# Patient Record
Sex: Male | Born: 1935
Health system: Southern US, Community
[De-identification: ages and names within clinical notes are randomized; demographics above are authoritative.]

## PROBLEM LIST (undated history)

## (undated) DIAGNOSIS — K219 Gastro-esophageal reflux disease without esophagitis: Secondary | ICD-10-CM

## (undated) DIAGNOSIS — E785 Hyperlipidemia, unspecified: Secondary | ICD-10-CM

## (undated) DIAGNOSIS — I10 Essential (primary) hypertension: Secondary | ICD-10-CM

## (undated) DIAGNOSIS — Z8619 Personal history of other infectious and parasitic diseases: Secondary | ICD-10-CM

## (undated) DIAGNOSIS — E079 Disorder of thyroid, unspecified: Secondary | ICD-10-CM

## (undated) DIAGNOSIS — E039 Hypothyroidism, unspecified: Secondary | ICD-10-CM

## (undated) HISTORY — PX: OTHER SURGICAL HISTORY: SHX169

## (undated) HISTORY — PX: CHOLECYSTECTOMY: SHX55

## (undated) HISTORY — DX: Disorder of thyroid, unspecified: E07.9

## (undated) HISTORY — DX: Hyperlipidemia, unspecified: E78.5

## (undated) HISTORY — DX: Essential (primary) hypertension: I10

## (undated) HISTORY — DX: Personal history of other infectious and parasitic diseases: Z86.19

---
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
## 2006-11-07 ENCOUNTER — Ambulatory Visit (HOSPITAL_COMMUNITY): Admission: RE | Admit: 2006-11-07 | Discharge: 2006-11-07 | Payer: Self-pay | Admitting: Ophthalmology

## 2010-10-06 ENCOUNTER — Encounter: Payer: Self-pay | Admitting: Nurse Practitioner

## 2010-10-06 DIAGNOSIS — E039 Hypothyroidism, unspecified: Secondary | ICD-10-CM | POA: Insufficient documentation

## 2010-10-06 DIAGNOSIS — E785 Hyperlipidemia, unspecified: Secondary | ICD-10-CM

## 2010-10-06 DIAGNOSIS — I10 Essential (primary) hypertension: Secondary | ICD-10-CM | POA: Insufficient documentation

## 2010-10-06 DIAGNOSIS — E559 Vitamin D deficiency, unspecified: Secondary | ICD-10-CM | POA: Insufficient documentation

## 2012-08-20 ENCOUNTER — Other Ambulatory Visit: Payer: Self-pay | Admitting: Family Medicine

## 2012-08-31 ENCOUNTER — Emergency Department (HOSPITAL_COMMUNITY)
Admission: EM | Admit: 2012-08-31 | Discharge: 2012-08-31 | Disposition: A | Discharge status: Home / Self Care | Payer: Medicare Other | Attending: Emergency Medicine | Admitting: Emergency Medicine

## 2012-08-31 ENCOUNTER — Encounter: Payer: Self-pay | Admitting: Physician Assistant

## 2012-08-31 ENCOUNTER — Ambulatory Visit (INDEPENDENT_AMBULATORY_CARE_PROVIDER_SITE_OTHER): Payer: Medicare Other | Admitting: Physician Assistant

## 2012-08-31 ENCOUNTER — Encounter (HOSPITAL_COMMUNITY): Payer: Self-pay | Admitting: *Deleted

## 2012-08-31 VITALS — BP 135/59 | HR 65 | Temp 97.7°F | Ht 69.0 in | Wt 189.0 lb

## 2012-08-31 DIAGNOSIS — Z87891 Personal history of nicotine dependence: Secondary | ICD-10-CM | POA: Insufficient documentation

## 2012-08-31 DIAGNOSIS — Z79899 Other long term (current) drug therapy: Secondary | ICD-10-CM | POA: Insufficient documentation

## 2012-08-31 DIAGNOSIS — R21 Rash and other nonspecific skin eruption: Secondary | ICD-10-CM | POA: Insufficient documentation

## 2012-08-31 DIAGNOSIS — I10 Essential (primary) hypertension: Secondary | ICD-10-CM | POA: Insufficient documentation

## 2012-08-31 DIAGNOSIS — B029 Zoster without complications: Secondary | ICD-10-CM | POA: Insufficient documentation

## 2012-08-31 DIAGNOSIS — E079 Disorder of thyroid, unspecified: Secondary | ICD-10-CM | POA: Insufficient documentation

## 2012-08-31 DIAGNOSIS — E785 Hyperlipidemia, unspecified: Secondary | ICD-10-CM | POA: Insufficient documentation

## 2012-08-31 MED ORDER — TETRACAINE HCL 0.5 % OP SOLN
2.0000 [drp] | Freq: Once | OPHTHALMIC | Status: AC
  Administered 2012-08-31: 2 [drp] via OPHTHALMIC
  Filled 2012-08-31: qty 2

## 2012-08-31 MED ORDER — PREDNISONE 20 MG PO TABS: ORAL_TABLET | ORAL | Status: DC

## 2012-08-31 MED ORDER — VALACYCLOVIR HCL 1 G PO TABS: 1000.0000 mg | ORAL_TABLET | Freq: Three times a day (TID) | ORAL | Status: AC

## 2012-08-31 MED ORDER — FLUORESCEIN SODIUM 1 MG OP STRP
1.0000 | ORAL_STRIP | Freq: Once | OPHTHALMIC | Status: AC
  Administered 2012-08-31: 1 via OPHTHALMIC
  Filled 2012-08-31: qty 1

## 2012-08-31 NOTE — ED Notes (Signed)
Pt diagnosed with shingles 3-4 days ago, was sent to er by pcp today for itching/shingles.

## 2012-08-31 NOTE — ED Provider Notes (Addendum)
History  This chart was scribed for Suzi Roots, MD by Erskine Emery, ED Scribe. This patient was seen in room APA17/APA17 and the patient's care was started at 14:49.   CSN: [id number]  Arrival date & time 08/31/12  1233   First MD Initiated Contact with Patient 08/31/12 1449      Chief Complaint  Patient presents with  . Herpes Zoster    (Consider location/radiation/quality/duration/timing/severity/associated sxs/prior treatment) The history is provided by the patient and a relative. No language interpreter was used.  Aaron Salazar is a 79 y.o. male who presents to the Emergency Department complaining of itching and scaling shingles, localized to his left eyebrow, for the past couple days. Pt was diagnosed with the shingles by his PCP today, who instructed him to come to the ED to make sure it is not spreading to his eye. Pt denies any other areas of similar symptoms, any h/o of similar symptoms, or any pain, just itching, and a painful, burning sensation to rash.  No eye pain, tearing, fb sensation, or change in vision. Pt has been putting lotion on the area which does not relief the symptoms.   Dr. Christell Constant is the pt's PCP.  Past Medical History  Diagnosis Date  . Thyroid disease   . Hypertension   . Hyperlipidemia     Past Surgical History  Procedure Laterality Date  . Cholecystectomy      Family History  Problem Relation Age of Onset  . Mental retardation Son     History  Substance Use Topics  . Smoking status: Former Smoker    Quit date: 08/31/1992  . Smokeless tobacco: Not on file  . Alcohol Use: No      Review of Systems  Constitutional: Negative for fever and chills.  HENT: Negative for neck pain.   Eyes: Negative for photophobia, pain, discharge, redness, itching and visual disturbance.  Respiratory: Negative for shortness of breath.   Gastrointestinal: Negative for nausea and vomiting.  Skin: Positive for rash.  Neurological: Negative for weakness  and headaches.  All other systems reviewed and are negative.    Allergies  Review of patient's allergies indicates no known allergies.  Home Medications   Current Outpatient Rx  Name  Route  Sig  Dispense  Refill  . levothyroxine (SYNTHROID, LEVOTHROID) 50 MCG tablet   Oral   Take 50 mcg by mouth daily.           Marland Kitchen losartan (COZAAR) 100 MG tablet      TAKE 1 TABLET BY MOUTH DAILY   30 tablet   0     Needs office visit   . rosuvastatin (CRESTOR) 10 MG tablet   Oral   Take 10 mg by mouth daily.             Triage Vitals: BP 157/75  Pulse 93  Temp(Src) 98.4 F (36.9 C) (Oral)  Resp 18  Ht 5\' 9"  (1.753 m)  Wt 189 lb (85.73 kg)  BMI 27.9 kg/m2  SpO2 96%  Physical Exam  Nursing note and vitals reviewed. Constitutional: He is oriented to person, place, and time. He appears well-developed and well-nourished. No distress.  HENT:  Head: Normocephalic and atraumatic.  Eyes: Conjunctivae and EOM are normal. Pupils are equal, round, and reactive to light. Right eye exhibits no discharge. Left eye exhibits no discharge.  Conj not injected. No eye pain or tearing. Fluorescein neg, no corneal lesions noted.   Neck: Neck supple. No tracheal deviation present.  Cardiovascular: Normal rate.   Pulmonary/Chest: Effort normal. No respiratory distress.  Abdominal: Soft. He exhibits no distension.  Musculoskeletal: Normal range of motion. He exhibits no edema.  Neurological: He is alert and oriented to person, place, and time.  Skin: Skin is warm and dry. Rash noted.  Localized erythematous vesicular rash to left eyebrow, and small area left forehead.   Psychiatric: He has a normal mood and affect.    ED Course  Procedures (including critical care time) DIAGNOSTIC STUDIES: Oxygen Saturation is 96% on room air, adequate by my interpretation.    COORDINATION OF CARE: 14:58--I evaluated the patient and we discussed a treatment plan including eye examination to which the pt  agreed.       MDM  I personally performed the services described in this documentation, which was scribed in my presence. The recorded information has been reviewed and is accurate.  No apparent eye/corneal involvement.  Pt denies needing/wanting pain medication.  Pt stable for d/c.   Suzi Roots, MD 08/31/12 1536  Suzi Roots, MD 08/31/12 1540

## 2012-08-31 NOTE — ED Notes (Signed)
Discharge instructions reviewed with pt, questions answered. Pt verbalized understanding.  

## 2012-08-31 NOTE — Progress Notes (Signed)
  Subjective:    Patient ID: Aaron Salazar, male    DOB: 1927-06-25, 79 y.o.   MRN: [id number]  HPI Itchy rash over left eye for the past several days   Review of Systems     Objective:   Physical Exam Cluster of five vesicles on erythematous base over left eye; lesions are itchy       Assessment & Plan:  Shingles with optic involvement  Pt. Sent to [facility] ER

## 2012-09-03 DIAGNOSIS — Z8619 Personal history of other infectious and parasitic diseases: Secondary | ICD-10-CM

## 2012-09-03 HISTORY — DX: Personal history of other infectious and parasitic diseases: Z86.19

## 2012-09-15 ENCOUNTER — Other Ambulatory Visit: Payer: Self-pay | Admitting: Family Medicine

## 2012-09-23 ENCOUNTER — Encounter: Payer: Self-pay | Admitting: *Deleted

## 2012-09-23 ENCOUNTER — Other Ambulatory Visit: Payer: Self-pay | Admitting: *Deleted

## 2012-09-23 MED ORDER — ROSUVASTATIN CALCIUM 10 MG PO TABS: 10.0000 mg | ORAL_TABLET | Freq: Every day | ORAL | Status: DC

## 2012-11-17 ENCOUNTER — Other Ambulatory Visit: Payer: Self-pay | Admitting: Family Medicine

## 2012-11-29 ENCOUNTER — Ambulatory Visit (INDEPENDENT_AMBULATORY_CARE_PROVIDER_SITE_OTHER): Payer: Medicare Other | Admitting: Family Medicine

## 2012-11-29 ENCOUNTER — Encounter: Payer: Self-pay | Admitting: Family Medicine

## 2012-11-29 VITALS — BP 159/75 | HR 62 | Temp 97.1°F | Wt 190.8 lb

## 2012-11-29 DIAGNOSIS — E785 Hyperlipidemia, unspecified: Secondary | ICD-10-CM

## 2012-11-29 DIAGNOSIS — E039 Hypothyroidism, unspecified: Secondary | ICD-10-CM

## 2012-11-29 DIAGNOSIS — I10 Essential (primary) hypertension: Secondary | ICD-10-CM

## 2012-11-29 LAB — LIPID PANEL
Cholesterol: 158 mg/dL (ref 0–200)
HDL: 37 mg/dL — ABNORMAL LOW (ref >39)
LDL Cholesterol: 79 mg/dL (ref 0–99)
Total CHOL/HDL Ratio: 4.3 Ratio
Triglycerides: 210 mg/dL — ABNORMAL HIGH (ref <150)
VLDL: 42 mg/dL — ABNORMAL HIGH (ref 0–40)

## 2012-11-29 LAB — POCT CBC
Granulocyte percent: 73 %G (ref 37–80)
HCT, POC: 44.4 % (ref 43.5–53.7)
Hemoglobin: 15.9 g/dL (ref 14.1–18.1)
Lymph, poc: 1.5 (ref 0.6–3.4)
MCH, POC: 32.2 pg — AB (ref 27–31.2)
MCHC: 35.8 g/dL — AB (ref 31.8–35.4)
MCV: 90.1 fL (ref 80–97)
MPV: 8.2 fL (ref 0–99.8)
POC Granulocyte: 4.9 (ref 2–6.9)
POC LYMPH PERCENT: 22.9 %L (ref 10–50)
Platelet Count, POC: 191 10*3/uL (ref 142–424)
RBC: 4.9 M/uL (ref 4.69–6.13)
RDW, POC: 13.6 %
WBC: 6.7 10*3/uL (ref 4.6–10.2)

## 2012-11-29 LAB — COMPLETE METABOLIC PANEL WITH GFR
ALT: 18 U/L (ref 0–53)
AST: 26 U/L (ref 0–37)
Albumin: 4.1 g/dL (ref 3.5–5.2)
Alkaline Phosphatase: 63 U/L (ref 39–117)
BUN: 17 mg/dL (ref 6–23)
CO2: 25 mEq/L (ref 19–32)
Calcium: 9.2 mg/dL (ref 8.4–10.5)
Chloride: 104 mEq/L (ref 96–112)
Creat: 1.23 mg/dL (ref 0.50–1.35)
GFR, Est African American: 63 mL/min
GFR, Est Non African American: 54 mL/min — ABNORMAL LOW
Glucose, Bld: 94 mg/dL (ref 70–99)
Potassium: 4.6 mEq/L (ref 3.5–5.3)
Sodium: 138 mEq/L (ref 135–145)
Total Bilirubin: 0.6 mg/dL (ref 0.3–1.2)
Total Protein: 6.7 g/dL (ref 6.0–8.3)

## 2012-11-29 LAB — TSH: TSH: 4.592 u[IU]/mL — ABNORMAL HIGH (ref 0.350–4.500)

## 2012-11-29 MED ORDER — ROSUVASTATIN CALCIUM 10 MG PO TABS: 10.0000 mg | ORAL_TABLET | Freq: Every day | ORAL | Status: DC

## 2012-11-29 MED ORDER — LEVOTHYROXINE SODIUM 50 MCG PO TABS: 50.0000 ug | ORAL_TABLET | Freq: Every day | ORAL | Status: DC

## 2012-11-29 MED ORDER — LOSARTAN POTASSIUM 100 MG PO TABS: 100.0000 mg | ORAL_TABLET | Freq: Every day | ORAL | Status: DC

## 2012-11-29 NOTE — Patient Instructions (Signed)
Hypertriglyceridemia  Diet for High blood levels of Triglycerides Most fats in food are triglycerides. Triglycerides in your blood are stored as fat in your body. High levels of triglycerides in your blood may put you at a greater risk for heart disease and stroke.  Normal triglyceride levels are less than 150 mg/dL. Borderline high levels are 150-199 mg/dl. High levels are 200 - 499 mg/dL, and very high triglyceride levels are greater than 500 mg/dL. The decision to treat high triglycerides is generally based on the level. For people with borderline or high triglyceride levels, treatment includes weight loss and exercise. Drugs are recommended for people with very high triglyceride levels. Many people who need treatment for high triglyceride levels have metabolic syndrome. This syndrome is a collection of disorders that often include: insulin resistance, high blood pressure, blood clotting problems, high cholesterol and triglycerides. TESTING PROCEDURE FOR TRIGLYCERIDES  You should not eat 4 hours before getting your triglycerides measured. The normal range of triglycerides is between 10 and 250 milligrams per deciliter (mg/dl). Some people may have extreme levels (1000 or above), but your triglyceride level may be too high if it is above 150 mg/dl, depending on what other risk factors you have for heart disease.  People with high blood triglycerides may also have high blood cholesterol levels. If you have high blood cholesterol as well as high blood triglycerides, your risk for heart disease is probably greater than if you only had high triglycerides. High blood cholesterol is one of the main risk factors for heart disease. CHANGING YOUR DIET  Your weight can affect your blood triglyceride level. If you are more than 20% above your ideal body weight, you may be able to lower your blood triglycerides by losing weight. Eating less and exercising regularly is the best way to combat this. Fat provides more  calories than any other food. The best way to lose weight is to eat less fat. Only 30% of your total calories should come from fat. Less than 7% of your diet should come from saturated fat. A diet low in fat and saturated fat is the same as a diet to decrease blood cholesterol. By eating a diet lower in fat, you may lose weight, lower your blood cholesterol, and lower your blood triglyceride level.  Eating a diet low in fat, especially saturated fat, may also help you lower your blood triglyceride level. Ask your dietitian to help you figure how much fat you can eat based on the number of calories your caregiver has prescribed for you.  Exercise, in addition to helping with weight loss may also help lower triglyceride levels.   Alcohol can increase blood triglycerides. You may need to stop drinking alcoholic beverages.  Too much carbohydrate in your diet may also increase your blood triglycerides. Some complex carbohydrates are necessary in your diet. These may include bread, rice, potatoes, other starchy vegetables and cereals.  Reduce "simple" carbohydrates. These may include pure sugars, candy, honey, and jelly without losing other nutrients. If you have the kind of high blood triglycerides that is affected by the amount of carbohydrates in your diet, you will need to eat less sugar and less high-sugar foods. Your caregiver can help you with this.  Adding 2-4 grams of fish oil (EPA+ DHA) may also help lower triglycerides. Speak with your caregiver before adding any supplements to your regimen. Following the Diet  Maintain your ideal weight. Your caregivers can help you with a diet. Generally, eating less food and getting more   exercise will help you lose weight. Joining a weight control group may also help. Ask your caregivers for a good weight control group in your area.  Eat low-fat foods instead of high-fat foods. This can help you lose weight too.  These foods are lower in fat. Eat MORE of these:    Dried beans, peas, and lentils.  Egg whites.  Low-fat cottage cheese.  Fish.  Lean cuts of meat, such as round, sirloin, rump, and flank (cut extra fat off meat you fix).  Whole grain breads, cereals and pasta.  Skim and nonfat dry milk.  Low-fat yogurt.  Poultry without the skin.  Cheese made with skim or part-skim milk, such as mozzarella, parmesan, farmers', ricotta, or pot cheese. These are higher fat foods. Eat LESS of these:   Whole milk and foods made from whole milk, such as American, blue, cheddar, monterey jack, and swiss cheese  High-fat meats, such as luncheon meats, sausages, knockwurst, bratwurst, hot dogs, ribs, corned beef, ground pork, and regular ground beef.  Fried foods. Limit saturated fats in your diet. Substituting unsaturated fat for saturated fat may decrease your blood triglyceride level. You will need to read package labels to know which products contain saturated fats.  These foods are high in saturated fat. Eat LESS of these:   Fried pork skins.  Whole milk.  Skin and fat from poultry.  Palm oil.  Butter.  Shortening.  Cream cheese.  Bacon.  Margarines and baked goods made from listed oils.  Vegetable shortenings.  Chitterlings.  Fat from meats.  Coconut oil.  Palm kernel oil.  Lard.  Cream.  Sour cream.  Fatback.  Coffee whiteners and non-dairy creamers made with these oils.  Cheese made from whole milk. Use unsaturated fats (both polyunsaturated and monounsaturated) moderately. Remember, even though unsaturated fats are better than saturated fats; you still want a diet low in total fat.  These foods are high in unsaturated fat:   Canola oil.  Sunflower oil.  Mayonnaise.  Almonds.  Peanuts.  Pine nuts.  Margarines made with these oils.  Safflower oil.  Olive oil.  Avocados.  Cashews.  Peanut butter.  Sunflower seeds.  Soybean oil.  Peanut  oil.  Olives.  Pecans.  Walnuts.  Pumpkin seeds. Avoid sugar and other high-sugar foods. This will decrease carbohydrates without decreasing other nutrients. Sugar in your food goes rapidly to your blood. When there is excess sugar in your blood, your liver may use it to make more triglycerides. Sugar also contains calories without other important nutrients.  Eat LESS of these:   Sugar, brown sugar, powdered sugar, jam, jelly, preserves, honey, syrup, molasses, pies, candy, cakes, cookies, frosting, pastries, colas, soft drinks, punches, fruit drinks, and regular gelatin.  Avoid alcohol. Alcohol, even more than sugar, may increase blood triglycerides. In addition, alcohol is high in calories and low in nutrients. Ask for sparkling water, or a diet soft drink instead of an alcoholic beverage. Suggestions for planning and preparing meals   Bake, broil, grill or roast meats instead of frying.  Remove fat from meats and skin from poultry before cooking.  Add spices, herbs, lemon juice or vinegar to vegetables instead of salt, rich sauces or gravies.  Use a non-stick skillet without fat or use no-stick sprays.  Cool and refrigerate stews and broth. Then remove the hardened fat floating on the surface before serving.  Refrigerate meat drippings and skim off fat to make low-fat gravies.  Serve more fish.  Use less butter,   margarine and other high-fat spreads on bread or vegetables.  Use skim or reconstituted non-fat dry milk for cooking.  Cook with low-fat cheeses.  Substitute low-fat yogurt or cottage cheese for all or part of the sour cream in recipes for sauces, dips or congealed salads.  Use half yogurt/half mayonnaise in salad recipes.  Substitute evaporated skim milk for cream. Evaporated skim milk or reconstituted non-fat dry milk can be whipped and substituted for whipped cream in certain recipes.  Choose fresh fruits for dessert instead of high-fat foods such as pies or  cakes. Fruits are naturally low in fat. When Dining Out   Order low-fat appetizers such as fruit or vegetable juice, pasta with vegetables or tomato sauce.  Select clear, rather than cream soups.  Ask that dressings and gravies be served on the side. Then use less of them.  Order foods that are baked, broiled, poached, steamed, stir-fried, or roasted.  Ask for margarine instead of butter, and use only a small amount.  Drink sparkling water, unsweetened tea or coffee, or diet soft drinks instead of alcohol or other sweet beverages. QUESTIONS AND ANSWERS ABOUT OTHER FATS IN THE BLOOD: SATURATED FAT, TRANS FAT, AND CHOLESTEROL What is trans fat? Trans fat is a type of fat that is formed when vegetable oil is hardened through a process called hydrogenation. This process helps makes foods more solid, gives them shape, and prolongs their shelf life. Trans fats are also called hydrogenated or partially hydrogenated oils.  What do saturated fat, trans fat, and cholesterol in foods have to do with heart disease? Saturated fat, trans fat, and cholesterol in the diet all raise the level of LDL "bad" cholesterol in the blood. The higher the LDL cholesterol, the greater the risk for coronary heart disease (CHD). Saturated fat and trans fat raise LDL similarly.  What foods contain saturated fat, trans fat, and cholesterol? High amounts of saturated fat are found in animal products, such as fatty cuts of meat, chicken skin, and full-fat dairy products like butter, whole milk, cream, and cheese, and in tropical vegetable oils such as palm, palm kernel, and coconut oil. Trans fat is found in some of the same foods as saturated fat, such as vegetable shortening, some margarines (especially hard or stick margarine), crackers, cookies, baked goods, fried foods, salad dressings, and other processed foods made with partially hydrogenated vegetable oils. Small amounts of trans fat also occur naturally in some animal  products, such as milk products, beef, and lamb. Foods high in cholesterol include liver, other organ meats, egg yolks, shrimp, and full-fat dairy products. How can I use the new food label to make heart-healthy food choices? Check the Nutrition Facts panel of the food label. Choose foods lower in saturated fat, trans fat, and cholesterol. For saturated fat and cholesterol, you can also use the Percent Daily Value (%DV): 5% DV or less is low, and 20% DV or more is high. (There is no %DV for trans fat.) Use the Nutrition Facts panel to choose foods low in saturated fat and cholesterol, and if the trans fat is not listed, read the ingredients and limit products that list shortening or hydrogenated or partially hydrogenated vegetable oil, which tend to be high in trans fat. POINTS TO REMEMBER:   Discuss your risk for heart disease with your caregivers, and take steps to reduce risk factors.  Change your diet. Choose foods that are low in saturated fat, trans fat, and cholesterol.  Add exercise to your daily routine if   it is not already being done. Participate in physical activity of moderate intensity, like brisk walking, for at least 30 minutes on most, and preferably all days of the week. No time? Break the 30 minutes into three, 10-minute segments during the day.  Stop smoking. If you do smoke, contact your caregiver to discuss ways in which they can help you quit.  Do not use street drugs.  Maintain a normal weight.  Maintain a healthy blood pressure.  Keep up with your blood work for checking the fats in your blood as directed by your caregiver. Document Released: 03/09/2004 Document Revised: 11/21/2011 Document Reviewed: 10/05/2008 [employer] Patient Information 2014 [employer].  

## 2012-11-29 NOTE — Progress Notes (Signed)
  Subjective:    Patient ID: Aaron Salazar, male    DOB: 1927/06/10, 79 y.o.   MRN: [id number]  HPI This 79 y.o. male presents for evaluation of hypothyroidism, and hyperlipidemia.  He had a bout of shingles in March of this year and does not report any recent episodes of shingles.  He is fasting and is due for labs.  He is taking crestor for hyperlipidemia and does not report any intolerance to crestor.   Review of Systems    No chest pain, SOB, HA, dizziness, vision change, N/V, diarrhea, constipation, dysuria, urinary urgency or frequency, myalgias, arthralgias or rash.  Objective:   Physical Exam  Vital signs noted  Well developed well nourished male.  HEENT - Head atraumatic Normocephalic                Eyes - PERRLA, Conjuctiva - clear Sclera- Clear EOMI                Ears - EAC's Wnl TM's Wnl Gross Hearing WNL                Nose - Nares patent                 Throat - oropharanx wnl Respiratory - Lungs CTA bilateral Cardiac - RRR S1 and S2 without murmur GI - Abdomen soft Nontender and bowel sounds active x 4 Extremities - No edema. Neuro - Grossly intact. Skin - SK lesion on right face and solar keratosis on nose and forehead.     Assessment & Plan:  Essential hypertension, benign - Plan: POCT CBC, losartan (COZAAR) 100 MG tablet, COMPLETE METABOLIC PANEL WITH GFR  Unspecified hypothyroidism - Plan: TSH, levothyroxine (SYNTHROID, LEVOTHROID) 50 MCG tablet  Other and unspecified hyperlipidemia - Plan: Lipid panel, rosuvastatin (CRESTOR) 10 MG tablet, COMPLETE METABOLIC PANEL WITH GFR  Follow up in 6 months.  Declines referral to Derm for skin lesions on face and declines shingles vaccination.

## 2013-01-30 ENCOUNTER — Other Ambulatory Visit: Payer: Self-pay | Admitting: Family Medicine

## 2013-02-28 ENCOUNTER — Other Ambulatory Visit: Payer: Self-pay | Admitting: Family Medicine

## 2013-06-02 ENCOUNTER — Ambulatory Visit: Payer: Medicare Other | Admitting: Family Medicine

## 2013-06-03 ENCOUNTER — Other Ambulatory Visit: Payer: Self-pay | Admitting: Family Medicine

## 2013-06-03 ENCOUNTER — Other Ambulatory Visit: Payer: Self-pay | Admitting: Nurse Practitioner

## 2013-06-06 NOTE — Telephone Encounter (Signed)
Last seen and last lipid 11/29/12  B [place]

## 2013-06-06 NOTE — Telephone Encounter (Signed)
Last seen 11/29/12  B [place]

## 2013-06-10 ENCOUNTER — Telehealth: Payer: Self-pay | Admitting: Family Medicine

## 2013-06-12 ENCOUNTER — Other Ambulatory Visit: Payer: Self-pay | Admitting: Family Medicine

## 2013-06-12 MED ORDER — PRAVASTATIN SODIUM 40 MG PO TABS: 40.0000 mg | ORAL_TABLET | Freq: Every day | ORAL | Status: DC

## 2013-06-17 NOTE — Telephone Encounter (Signed)
Patient was switched from Crestor 40 mg to Pravastatin 40 mg

## 2013-07-02 ENCOUNTER — Other Ambulatory Visit: Payer: Self-pay | Admitting: Family Medicine

## 2013-08-02 ENCOUNTER — Other Ambulatory Visit: Payer: Self-pay | Admitting: Family Medicine

## 2013-08-23 ENCOUNTER — Other Ambulatory Visit: Payer: Self-pay | Admitting: Family Medicine

## 2013-09-02 ENCOUNTER — Other Ambulatory Visit: Payer: Self-pay | Admitting: *Deleted

## 2013-09-02 NOTE — Telephone Encounter (Signed)
Last ov 6/14. Ntbs. Last thyroid level 6/14.

## 2013-09-03 MED ORDER — LOSARTAN POTASSIUM 100 MG PO TABS: ORAL_TABLET | ORAL | Status: DC

## 2013-09-03 MED ORDER — LEVOTHYROXINE SODIUM 50 MCG PO TABS: ORAL_TABLET | ORAL | Status: DC

## 2013-10-07 ENCOUNTER — Other Ambulatory Visit: Payer: Self-pay | Admitting: Family Medicine

## 2013-10-10 ENCOUNTER — Other Ambulatory Visit: Payer: Self-pay | Admitting: Family Medicine

## 2013-10-13 NOTE — Telephone Encounter (Signed)
Last seen 11/29/12  B [place]

## 2013-11-20 ENCOUNTER — Other Ambulatory Visit: Payer: Self-pay | Admitting: Family Medicine

## 2013-12-08 ENCOUNTER — Other Ambulatory Visit: Payer: Self-pay | Admitting: Family Medicine

## 2013-12-11 ENCOUNTER — Other Ambulatory Visit: Payer: Self-pay | Admitting: Family Medicine

## 2014-01-06 ENCOUNTER — Other Ambulatory Visit: Payer: Self-pay | Admitting: Family Medicine

## 2014-01-07 NOTE — Telephone Encounter (Signed)
Patient notified at last refill that NTBS. Please advise 

## 2014-02-05 ENCOUNTER — Other Ambulatory Visit: Payer: Self-pay | Admitting: Family Medicine

## 2014-02-10 ENCOUNTER — Other Ambulatory Visit: Payer: Self-pay | Admitting: Family Medicine

## 2014-02-10 ENCOUNTER — Telehealth: Payer: Self-pay | Admitting: Family Medicine

## 2014-02-10 DIAGNOSIS — E039 Hypothyroidism, unspecified: Secondary | ICD-10-CM

## 2014-02-10 MED ORDER — LEVOTHYROXINE SODIUM 50 MCG PO TABS: 50.0000 ug | ORAL_TABLET | Freq: Every day | ORAL | Status: DC

## 2014-02-10 NOTE — Telephone Encounter (Signed)
rx for levothyroxine sent to [facility]

## 2014-02-23 ENCOUNTER — Ambulatory Visit: Payer: Medicare Other | Admitting: Family Medicine

## 2014-02-28 ENCOUNTER — Other Ambulatory Visit: Payer: Self-pay | Admitting: Family Medicine

## 2014-06-12 ENCOUNTER — Other Ambulatory Visit: Payer: Self-pay | Admitting: Family Medicine

## 2014-06-16 ENCOUNTER — Telehealth: Payer: Self-pay | Admitting: Family Medicine

## 2014-06-18 ENCOUNTER — Ambulatory Visit (INDEPENDENT_AMBULATORY_CARE_PROVIDER_SITE_OTHER): Payer: Medicare Other | Admitting: Family Medicine

## 2014-06-18 ENCOUNTER — Encounter: Payer: Self-pay | Admitting: Family Medicine

## 2014-06-18 VITALS — BP 156/85 | HR 86 | Temp 97.2°F | Ht 69.0 in | Wt 186.0 lb

## 2014-06-18 DIAGNOSIS — E039 Hypothyroidism, unspecified: Secondary | ICD-10-CM | POA: Diagnosis not present

## 2014-06-18 DIAGNOSIS — I10 Essential (primary) hypertension: Secondary | ICD-10-CM | POA: Diagnosis not present

## 2014-06-18 DIAGNOSIS — E785 Hyperlipidemia, unspecified: Secondary | ICD-10-CM | POA: Diagnosis not present

## 2014-06-18 MED ORDER — PRAVASTATIN SODIUM 40 MG PO TABS: 40.0000 mg | ORAL_TABLET | Freq: Every day | ORAL | Status: DC

## 2014-06-18 NOTE — Patient Instructions (Signed)
Place hyperlipidemia patient instructions here.

## 2014-06-18 NOTE — Progress Notes (Signed)
   Subjective:    Patient ID: Aaron Salazar, male    DOB: April 30, 1928, 81 y.o.   MRN: [id number]  HPI 81 year old gentleman here for refill on his cholesterol medicine. Basically he's healthy there is some history of hypothyroidism as well as hypertension, but he is not on medication for blood pressure. He denies any side effects from statins such as myalgias. He lives alone having been married 4 times. His diet consists mostly of prepackaged dinners that he heats in the microwave.    Review of Systems  Constitutional: Negative.   Respiratory: Negative.   Cardiovascular: Negative.   Gastrointestinal: Negative.   Neurological: Negative.   Psychiatric/Behavioral: Negative.        Objective:   Physical Exam  Constitutional: He is oriented to person, place, and time. He appears well-developed and well-nourished.  Neck: Normal range of motion.  Cardiovascular: Normal rate and regular rhythm.   Pulmonary/Chest: Effort normal and breath sounds normal.  Abdominal: Soft. He exhibits no mass.  Neurological: He is alert and oriented to person, place, and time.          Assessment & Plan:  1. Hyperlipidemia  - Lipid panel - CMP14+EGFR  2. Hypothyroidism, unspecified hypothyroidism type  - TSH  3. Essential hypertension On no meds. Pressure is acceptable level for age.  Wardell Honour MD

## 2014-06-18 NOTE — Progress Notes (Signed)
   Subjective:    Patient ID: Aaron Salazar, male    DOB: 10-Feb-1928, 81 y.o.   MRN: [id number]  HPI Patient comes in today to follow up on chronic medical conditions and to have medications refilled.  Patient Active Problem List   Diagnosis Date Noted  . Hypothyroidism 10/06/2010  . HTN (hypertension) 10/06/2010  . Hyperlipidemia 10/06/2010  . Vitamin D deficiency 10/06/2010   Outpatient Encounter Prescriptions as of 06/18/2014  Medication Sig  . docusate sodium (COLACE) 100 MG capsule Take 100 mg by mouth daily.  Marland Kitchen levothyroxine (SYNTHROID, LEVOTHROID) 50 MCG tablet Take 1 tablet (50 mcg total) by mouth daily before breakfast.  . pravastatin (PRAVACHOL) 40 MG tablet Take 1 tablet (40 mg total) by mouth daily.  . [DISCONTINUED] levothyroxine (SYNTHROID, LEVOTHROID) 50 MCG tablet TAKE 1 TABLET EVERY DAY  . [DISCONTINUED] losartan (COZAAR) 100 MG tablet TAKE 1 TABLET (100 MG TOTAL) BY MOUTH DAILY.  . [DISCONTINUED] CRESTOR 10 MG tablet TAKE 1 TABLET (10 MG TOTAL) BY MOUTH DAILY.  . [DISCONTINUED] levothyroxine (SYNTHROID, LEVOTHROID) 50 MCG tablet TAKE 1 TABLET EVERY DAY  . [DISCONTINUED] Omega-3 Fatty Acids (FISH OIL) 1000 MG CAPS Take 1,000 mg by mouth 2 (two) times daily.  . [DISCONTINUED] predniSONE (DELTASONE) 20 MG tablet 3 po once a day for 2 days, then 2 po once a day for 3 days, then 1 po once a day for 3 days      Review of Systems     Objective:   Physical Exam  BP 156/85 mmHg  Pulse 86  Temp(Src) 97.2 F (36.2 C) (Oral)  Ht 5\' 9"  (1.753 m)  Wt 186 lb (84.369 kg)  BMI 27.45 kg/m2       Assessment & Plan:

## 2014-06-19 LAB — TSH: TSH: 5.19 u[IU]/mL — ABNORMAL HIGH (ref 0.450–4.500)

## 2014-06-19 LAB — LIPID PANEL
CHOLESTEROL TOTAL: 202 mg/dL — AB (ref 100–199)
Chol/HDL Ratio: 5.8 ratio units — ABNORMAL HIGH (ref 0.0–5.0)
HDL: 35 mg/dL — ABNORMAL LOW (ref >39)
LDL CALC: 127 mg/dL — AB (ref 0–99)
Triglycerides: 200 mg/dL — ABNORMAL HIGH (ref 0–149)
VLDL Cholesterol Cal: 40 mg/dL (ref 5–40)

## 2014-06-19 LAB — CMP14+EGFR
A/G RATIO: 1.8 (ref 1.1–2.5)
ALBUMIN: 4.4 g/dL (ref 3.5–4.7)
ALK PHOS: 87 IU/L (ref 39–117)
ALT: 16 IU/L (ref 0–44)
AST: 19 IU/L (ref 0–40)
BILIRUBIN TOTAL: 0.7 mg/dL (ref 0.0–1.2)
BUN/Creatinine Ratio: 13 (ref 10–22)
BUN: 16 mg/dL (ref 8–27)
CALCIUM: 9.5 mg/dL (ref 8.6–10.2)
CHLORIDE: 100 mmol/L (ref 97–108)
CO2: 24 mmol/L (ref 18–29)
CREATININE: 1.28 mg/dL — AB (ref 0.76–1.27)
GFR calc non Af Amer: 51 mL/min/{1.73_m2} — ABNORMAL LOW (ref >59)
GFR, EST AFRICAN AMERICAN: 59 mL/min/{1.73_m2} — AB (ref >59)
GLUCOSE: 102 mg/dL — AB (ref 65–99)
Globulin, Total: 2.5 g/dL (ref 1.5–4.5)
Potassium: 4.6 mmol/L (ref 3.5–5.2)
Sodium: 139 mmol/L (ref 134–144)
TOTAL PROTEIN: 6.9 g/dL (ref 6.0–8.5)

## 2014-06-19 NOTE — Telephone Encounter (Signed)
Calling to go over test results.

## 2014-06-19 NOTE — Telephone Encounter (Signed)
-----   Message from Aaron Honour, MD sent at 06/19/2014  8:03 AM EST ----- LDL, bad cholesterol, has increased. Be sure and take statin every day and repeat in 6 months;  TSH results suggest he needs slightly higher dose of thyroid replacement increased from 50-75 g and repeat in 2 months

## 2014-08-08 ENCOUNTER — Other Ambulatory Visit: Payer: Self-pay | Admitting: Family Medicine

## 2014-10-29 ENCOUNTER — Ambulatory Visit (INDEPENDENT_AMBULATORY_CARE_PROVIDER_SITE_OTHER): Payer: Medicare Other | Admitting: Nurse Practitioner

## 2014-10-29 ENCOUNTER — Encounter: Payer: Self-pay | Admitting: Nurse Practitioner

## 2014-10-29 VITALS — BP 150/84 | HR 77 | Temp 97.1°F | Ht 69.0 in | Wt 184.0 lb

## 2014-10-29 DIAGNOSIS — H6122 Impacted cerumen, left ear: Secondary | ICD-10-CM | POA: Diagnosis not present

## 2014-10-29 NOTE — Progress Notes (Signed)
   Subjective:    Patient ID: Aaron Salazar, male    DOB: 1928-05-27, 81 y.o.   MRN: [id number]  HPI Patient in today c/o left ear feeling stopped up- slight decrease in hearing- no drainage- no pain    Review of Systems  Constitutional: Negative.   HENT: Negative.   Respiratory: Negative.   Cardiovascular: Negative.   Genitourinary: Negative.   Neurological: Negative.   Psychiatric/Behavioral: Negative.   All other systems reviewed and are negative.      Objective:   Physical Exam  Constitutional: He is oriented to person, place, and time. He appears well-developed and well-nourished. No distress.  HENT:  Right Ear: Hearing, tympanic membrane, external ear and ear canal normal.  Left Ear: Hearing, tympanic membrane and external ear normal. A foreign body (cerumen impaction) is present.  Neck: Normal range of motion.  Cardiovascular: Normal rate, regular rhythm and normal heart sounds.   Pulmonary/Chest: Effort normal and breath sounds normal.  Neurological: He is alert and oriented to person, place, and time.  Skin: Skin is warm.  Psychiatric: He has a normal mood and affect. His behavior is normal. Judgment and thought content normal.   BP 150/84 mmHg  Pulse 77  Temp(Src) 97.1 F (36.2 C) (Oral)  Ht '5\' 9"'$  (1.753 m)  Wt 184 lb (83.462 kg)  BMI 27.16 kg/m2  S/P ear irrigation- TM clear      Assessment & Plan:   1. Cerumen impaction, left    Debrox OTC several times a week RTO prn  Mary-Margaret Hassell Done, FNP

## 2014-10-29 NOTE — Patient Instructions (Signed)
Cerumen Impaction °A cerumen impaction is when the wax in your ear forms a plug. This plug usually causes reduced hearing. Sometimes it also causes an earache or dizziness. Removing a cerumen impaction can be difficult and painful. The wax sticks to the ear canal. The canal is sensitive and bleeds easily. If you try to remove a heavy wax buildup with a cotton tipped swab, you may push it in further. °Irrigation with water, suction, and small ear curettes may be used to clear out the wax. If the impaction is fixed to the skin in the ear canal, ear drops may be needed for a few days to loosen the wax. People who build up a lot of wax frequently can use ear wax removal products available in your local drugstore. °SEEK MEDICAL CARE IF:  °You develop an earache, increased hearing loss, or marked dizziness. °Document Released: 06/29/2004 Document Revised: 08/14/2011 Document Reviewed: 08/19/2009 °ExitCare® Patient Information ©2015 [employer]. This information is not intended to replace advice given to you by your health care provider. Make sure you discuss any questions you have with your health care provider. ° °

## 2015-03-23 ENCOUNTER — Ambulatory Visit (INDEPENDENT_AMBULATORY_CARE_PROVIDER_SITE_OTHER): Payer: Medicare Other | Admitting: Family Medicine

## 2015-03-23 ENCOUNTER — Encounter: Payer: Self-pay | Admitting: Family Medicine

## 2015-03-23 VITALS — BP 147/81 | HR 73 | Temp 98.1°F | Ht 69.0 in | Wt 187.6 lb

## 2015-03-23 DIAGNOSIS — E039 Hypothyroidism, unspecified: Secondary | ICD-10-CM

## 2015-03-23 NOTE — Progress Notes (Signed)
   Subjective:    Patient ID: Aaron Salazar, male    DOB: 1927/06/29, 81 y.o.   MRN: [id number]  HPI 81 year old gentleman here to follow-up. His only medications include thyroid replacement. He is not taking blood pressure or lipids pills.    Review of Systems  Constitutional: Negative.   HENT: Negative.   Respiratory: Negative.   Cardiovascular: Negative.   Gastrointestinal: Negative.   Neurological: Negative.   Psychiatric/Behavioral: Negative.        Objective:   Physical Exam  Constitutional: He is oriented to person, place, and time. He appears well-developed and well-nourished.  HENT:  Head: Normocephalic.  Cardiovascular: Normal rate and regular rhythm.   Pulmonary/Chest: Effort normal and breath sounds normal.  Neurological: He is alert and oriented to person, place, and time.  Psychiatric: He has a normal mood and affect. Thought content normal.          Assessment & Plan:  1. Hypothyroidism, unspecified hypothyroidism type *Patient was supposed to have increases thyroid medicine from 50-75 at last visit but I do not think this happened. We'll go ahead and wait till January and check it again. He has no other complaints or problems. Blood pressure is acceptable for age  Wardell Honour MD

## 2015-04-06 ENCOUNTER — Telehealth: Payer: Self-pay | Admitting: Family Medicine

## 2015-05-27 ENCOUNTER — Other Ambulatory Visit: Payer: Self-pay | Admitting: *Deleted

## 2015-05-27 MED ORDER — LEVOTHYROXINE SODIUM 50 MCG PO TABS: ORAL_TABLET | ORAL | Status: DC

## 2015-06-24 ENCOUNTER — Other Ambulatory Visit: Payer: Self-pay

## 2015-06-24 NOTE — Telephone Encounter (Signed)
Refill for one month but needs TSH checked yearly and it is time to do that

## 2015-06-24 NOTE — Telephone Encounter (Signed)
Last seen 03/23/15 Dr Sabra Heck   Last thyroid 06/18/14

## 2015-06-24 NOTE — Telephone Encounter (Signed)
Last seen 03/23/15  Dr Sabra Heck  Last thyroid level  06/18/14

## 2015-06-24 NOTE — Telephone Encounter (Signed)
NA to inform pt that they need labs drawn for thyroid

## 2015-06-25 ENCOUNTER — Other Ambulatory Visit: Payer: Self-pay

## 2015-06-25 NOTE — Telephone Encounter (Signed)
x

## 2015-06-25 NOTE — Telephone Encounter (Signed)
Last seen 03/23/15  Dr Sabra Heck  Last thyroid 06/18/14

## 2015-06-29 MED ORDER — LEVOTHYROXINE SODIUM 50 MCG PO TABS: ORAL_TABLET | ORAL | Status: DC

## 2015-07-13 ENCOUNTER — Ambulatory Visit: Payer: Medicare Other | Admitting: Family Medicine

## 2015-07-14 ENCOUNTER — Encounter: Payer: Self-pay | Admitting: Family Medicine

## 2015-07-20 ENCOUNTER — Encounter: Payer: Self-pay | Admitting: Family Medicine

## 2015-07-20 ENCOUNTER — Ambulatory Visit (INDEPENDENT_AMBULATORY_CARE_PROVIDER_SITE_OTHER): Payer: Medicare Other | Admitting: Family Medicine

## 2015-07-20 VITALS — BP 152/78 | HR 77 | Temp 97.4°F | Ht 69.0 in | Wt 184.4 lb

## 2015-07-20 DIAGNOSIS — E039 Hypothyroidism, unspecified: Secondary | ICD-10-CM

## 2015-07-20 DIAGNOSIS — L858 Other specified epidermal thickening: Secondary | ICD-10-CM | POA: Diagnosis not present

## 2015-07-20 DIAGNOSIS — E785 Hyperlipidemia, unspecified: Secondary | ICD-10-CM | POA: Diagnosis not present

## 2015-07-20 DIAGNOSIS — L821 Other seborrheic keratosis: Secondary | ICD-10-CM | POA: Diagnosis not present

## 2015-07-20 DIAGNOSIS — I10 Essential (primary) hypertension: Secondary | ICD-10-CM

## 2015-07-20 DIAGNOSIS — D485 Neoplasm of uncertain behavior of skin: Secondary | ICD-10-CM

## 2015-07-20 NOTE — Progress Notes (Signed)
   Subjective:    Patient ID: Lacey Jensen, male    DOB: 28-Apr-1928, 82 y.o.   MRN: [id number]  HPI 82 year old gentleman who is here today to check his thyroid area he takes replacement hormone and it was last checked 1 year ago. He also takes a statin for lipids. He has never had any heart problems. He is most concerned today about a small place on his face. It looks like a cutaneous horn but he would like to have this removed to be sure it's not a skin cancer.  Patient Active Problem List   Diagnosis Date Noted  . Hypothyroidism 10/06/2010  . HTN (hypertension) 10/06/2010  . Hyperlipidemia 10/06/2010  . Vitamin D deficiency 10/06/2010   Outpatient Encounter Prescriptions as of 07/20/2015  Medication Sig  . docusate sodium (COLACE) 100 MG capsule Take 100 mg by mouth daily.  Marland Kitchen levothyroxine (SYNTHROID, LEVOTHROID) 50 MCG tablet TAKE 1 TABLET (50 MCG TOTAL) BY MOUTH DAILY BEFORE BREAKFAST.  Marland Kitchen pravastatin (PRAVACHOL) 40 MG tablet Take 1 tablet by mouth daily.   No facility-administered encounter medications on file as of 07/20/2015.      Review of Systems  Constitutional: Negative.   HENT: Negative.   Eyes: Negative.   Respiratory: Negative.  Negative for shortness of breath.   Cardiovascular: Negative.  Negative for chest pain and leg swelling.  Gastrointestinal: Negative.   Genitourinary: Negative.   Musculoskeletal: Negative.   Skin: Negative.   Neurological: Negative.   Psychiatric/Behavioral: Negative.   All other systems reviewed and are negative.      Objective:   Physical Exam  Constitutional: He appears well-developed and well-nourished.  Cardiovascular: Normal rate and regular rhythm.   Pulmonary/Chest: Effort normal and breath sounds normal.  Skin:  Area of concern under his right eye was anesthetized with 1% with epi and 80 shave excision was performed with a curette. Specimen will be sent for analysis. Monsel solution was applied for hemostasis with a small  spot Band-Aid applied.          Assessment & Plan:  1. Hypothyroidism, unspecified hypothyroidism type TSH today. Vision is asymptomatic for thyroid issues - CMP14+EGFR - Lipid panel - Thyroid Panel With TSH  2. Hyperlipidemia We will check lipids today but at his age I would suggest discontinuing statin. We will check lipids again at next visit. With no heart disease or other significant risk I think it's safe to stop the statin - CMP14+EGFR - Lipid panel - Thyroid Panel With TSH  3. Essential hypertension Pressure at 152/78 good for his age of 83. He is on no medication - CMP14+EGFR - Lipid panel - Thyroid Panel With TSH  Wardell Honour MD

## 2015-07-20 NOTE — Addendum Note (Signed)
Addended by: Jamelle Haring on: 07/20/2015 05:04 PM   Modules accepted: Orders, SmartSet

## 2015-07-21 LAB — CMP14+EGFR
A/G RATIO: 1.8 (ref 1.1–2.5)
ALBUMIN: 4.3 g/dL (ref 3.5–4.7)
ALT: 16 IU/L (ref 0–44)
AST: 19 IU/L (ref 0–40)
Alkaline Phosphatase: 81 IU/L (ref 39–117)
BILIRUBIN TOTAL: 0.6 mg/dL (ref 0.0–1.2)
BUN/Creatinine Ratio: 10 (ref 10–22)
BUN: 12 mg/dL (ref 8–27)
CO2: 22 mmol/L (ref 18–29)
CREATININE: 1.19 mg/dL (ref 0.76–1.27)
Calcium: 9.4 mg/dL (ref 8.6–10.2)
Chloride: 103 mmol/L (ref 96–106)
GFR calc Af Amer: 64 mL/min/{1.73_m2} (ref >59)
GFR calc non Af Amer: 55 mL/min/{1.73_m2} — ABNORMAL LOW (ref >59)
GLOBULIN, TOTAL: 2.4 g/dL (ref 1.5–4.5)
GLUCOSE: 99 mg/dL (ref 65–99)
Potassium: 4.4 mmol/L (ref 3.5–5.2)
SODIUM: 141 mmol/L (ref 134–144)
TOTAL PROTEIN: 6.7 g/dL (ref 6.0–8.5)

## 2015-07-21 LAB — LIPID PANEL
CHOLESTEROL TOTAL: 262 mg/dL — AB (ref 100–199)
Chol/HDL Ratio: 7.3 ratio units — ABNORMAL HIGH (ref 0.0–5.0)
HDL: 36 mg/dL — ABNORMAL LOW (ref >39)
LDL CALC: 177 mg/dL — AB (ref 0–99)
TRIGLYCERIDES: 247 mg/dL — AB (ref 0–149)
VLDL CHOLESTEROL CAL: 49 mg/dL — AB (ref 5–40)

## 2015-07-21 LAB — THYROID PANEL WITH TSH
Free Thyroxine Index: 2.1 (ref 1.2–4.9)
T3 UPTAKE RATIO: 32 % (ref 24–39)
T4, Total: 6.5 ug/dL (ref 4.5–12.0)
TSH: 1.68 u[IU]/mL (ref 0.450–4.500)

## 2015-07-22 LAB — PATHOLOGY

## 2015-08-31 ENCOUNTER — Other Ambulatory Visit: Payer: Self-pay | Admitting: Family Medicine

## 2015-12-01 DIAGNOSIS — H25811 Combined forms of age-related cataract, right eye: Secondary | ICD-10-CM | POA: Diagnosis not present

## 2015-12-01 DIAGNOSIS — Z961 Presence of intraocular lens: Secondary | ICD-10-CM | POA: Diagnosis not present

## 2015-12-01 DIAGNOSIS — H1851 Endothelial corneal dystrophy: Secondary | ICD-10-CM | POA: Diagnosis not present

## 2015-12-01 DIAGNOSIS — H353131 Nonexudative age-related macular degeneration, bilateral, early dry stage: Secondary | ICD-10-CM | POA: Diagnosis not present

## 2015-12-21 ENCOUNTER — Ambulatory Visit (INDEPENDENT_AMBULATORY_CARE_PROVIDER_SITE_OTHER): Payer: Medicare Other | Admitting: Family

## 2015-12-21 ENCOUNTER — Encounter: Payer: Self-pay | Admitting: Family

## 2015-12-21 VITALS — BP 135/72 | HR 75 | Temp 97.8°F | Ht 69.0 in | Wt 186.0 lb

## 2015-12-21 DIAGNOSIS — S0300XA Dislocation of jaw, unspecified side, initial encounter: Secondary | ICD-10-CM

## 2015-12-21 DIAGNOSIS — M26602 Left temporomandibular joint disorder, unspecified: Secondary | ICD-10-CM | POA: Diagnosis not present

## 2015-12-21 MED ORDER — NAPROXEN 500 MG PO TABS: 500.0000 mg | ORAL_TABLET | Freq: Two times a day (BID) | ORAL | Status: DC

## 2015-12-21 NOTE — Progress Notes (Signed)
   Subjective:    Patient ID: Aaron Salazar, male    DOB: 04-Feb-1928, 82 y.o.   MRN: [id number]  HPI Pt presents to the office today with left jaw and left ear pain. PT states this started a few days ago and it worse when he chews. PT states he is at the point where he hates to eat, because of the pain. Pt states he has intermittent aching of 7-8 out 10. PT states he has taken motrin with moderate relief.    Review of Systems  Constitutional: Negative.   HENT: Positive for ear pain.   Respiratory: Negative.   Cardiovascular: Negative.   Gastrointestinal: Negative.   Endocrine: Negative.   Genitourinary: Negative.   Musculoskeletal: Negative.   Neurological: Negative.   Hematological: Negative.   Psychiatric/Behavioral: Negative.   All other systems reviewed and are negative.      Objective:   Physical Exam  Constitutional: He is oriented to person, place, and time. He appears well-developed and well-nourished. No distress.  HENT:  Head: Normocephalic.  Right Ear: External ear normal.  Left Ear: External ear normal.  Mouth/Throat: Oropharynx is clear and moist.  Full of jaw, no oral lesions present   Neck: Normal range of motion. Neck supple. No thyromegaly present.  Cardiovascular: Normal rate, regular rhythm, normal heart sounds and intact distal pulses.   No murmur heard. Pulmonary/Chest: Effort normal and breath sounds normal. No respiratory distress. He has no wheezes.  Abdominal: Soft. Bowel sounds are normal. He exhibits no distension. There is no tenderness.  Musculoskeletal: Normal range of motion.  Neurological: He is alert and oriented to person, place, and time.  Skin: Skin is warm and dry. No rash noted. No erythema.  Very dry flaky skin   Psychiatric: He has a normal mood and affect. His behavior is normal. Judgment and thought content normal.  Vitals reviewed.   BP 135/72 mmHg  Pulse 75  Temp(Src) 97.8 F (36.6 C) (Oral)  Ht '5\' 9"'$  (1.753 m)  Wt 186  lb (84.369 kg)  BMI 27.45 kg/m2       Assessment & Plan:  1. TMJ (dislocation of temporomandibular joint), initial encounter -Ice -Rest- Avoid food that require a lot of chewing -Massage -Take naprosyn for next 5-7 days with food BID -RTO prn  - naproxen (NAPROSYN) 500 MG tablet; Take 1 tablet (500 mg total) by mouth 2 (two) times daily with a meal.  Dispense: 30 tablet; Refill: 0  Evelina Dun, FNP

## 2015-12-21 NOTE — Patient Instructions (Signed)

## 2016-01-07 ENCOUNTER — Ambulatory Visit (INDEPENDENT_AMBULATORY_CARE_PROVIDER_SITE_OTHER): Payer: Medicare Other | Admitting: Family Medicine

## 2016-01-07 ENCOUNTER — Encounter: Payer: Self-pay | Admitting: Family Medicine

## 2016-01-07 VITALS — BP 175/72 | HR 72 | Temp 98.9°F | Ht 69.0 in | Wt 188.0 lb

## 2016-01-07 DIAGNOSIS — M26629 Arthralgia of temporomandibular joint, unspecified side: Secondary | ICD-10-CM

## 2016-01-07 NOTE — Progress Notes (Signed)
   Subjective:    Patient ID: Aaron Salazar, male    DOB: 08-Aug-1927, 82 y.o.   MRN: [id number]  HPI Pt here for continued left ear pain. He was seen recently and given Naprosyn for left TMJ. He reports that symptoms have improved. He has also been trying to reduce chewing. He still complains of some discomfort in the ear and decreased hearing and thinks that lacks may need to be irrigated    Patient Active Problem List   Diagnosis Date Noted  . Hypothyroidism 10/06/2010  . HTN (hypertension) 10/06/2010  . Hyperlipidemia 10/06/2010  . Vitamin D deficiency 10/06/2010   Outpatient Encounter Prescriptions as of 01/07/2016  Medication Sig  . docusate sodium (COLACE) 100 MG capsule Take 100 mg by mouth daily. Reported on 12/21/2015  . levothyroxine (SYNTHROID, LEVOTHROID) 50 MCG tablet TAKE 1 TABLET (50 MCG TOTAL) BY MOUTH DAILY BEFORE BREAKFAST.  . naproxen (NAPROSYN) 500 MG tablet Take 1 tablet (500 mg total) by mouth 2 (two) times daily with a meal.  . [DISCONTINUED] pravastatin (PRAVACHOL) 40 MG tablet Take 1 tablet by mouth daily. Reported on 12/21/2015   No facility-administered encounter medications on file as of 01/07/2016.      Review of Systems  Constitutional: Negative.   HENT: Positive for ear pain (left).   Eyes: Negative.   Respiratory: Negative.   Cardiovascular: Negative.   Gastrointestinal: Negative.   Endocrine: Negative.   Genitourinary: Negative.   Musculoskeletal: Negative.   Skin: Negative.   Allergic/Immunologic: Negative.   Neurological: Negative.   Hematological: Negative.   Psychiatric/Behavioral: Negative.        Objective:   Physical Exam  Constitutional: He appears well-developed and well-nourished.  HENT:  Left Ear: External ear normal.  Excess cerumen in left internal auditory canal. Irrigated    BP (!) 175/72 (BP Location: Left Arm)   Pulse 72   Temp 98.9 F (37.2 C) (Oral)   Ht '5\' 9"'$  (1.753 m)   Wt 188 lb (85.3 kg)   BMI 27.76 kg/m          Assessment & Plan:  1. TMJ syndrome Continue with Naprosyn encouraged to take with meals. Continue to minimize excessive chewing.  Wardell Honour MD

## 2016-03-06 DIAGNOSIS — H2511 Age-related nuclear cataract, right eye: Secondary | ICD-10-CM | POA: Diagnosis not present

## 2016-03-06 DIAGNOSIS — H353132 Nonexudative age-related macular degeneration, bilateral, intermediate dry stage: Secondary | ICD-10-CM | POA: Diagnosis not present

## 2016-03-06 DIAGNOSIS — H1851 Endothelial corneal dystrophy: Secondary | ICD-10-CM | POA: Diagnosis not present

## 2016-03-06 DIAGNOSIS — H25811 Combined forms of age-related cataract, right eye: Secondary | ICD-10-CM | POA: Diagnosis not present

## 2016-03-06 DIAGNOSIS — Z961 Presence of intraocular lens: Secondary | ICD-10-CM | POA: Diagnosis not present

## 2016-03-07 NOTE — Patient Instructions (Signed)
Your procedure is scheduled on: 03/13/2016  Report to [facility] at  34  AM.  Call this number if you have problems the morning of surgery: [phone]   Do not eat food or drink liquids :After Midnight.      Take these medicines the morning of surgery with A SIP OF WATER: synthroid   Do not wear jewelry, make-up or nail polish.  Do not wear lotions, powders, or perfumes. You may wear deodorant.  Do not shave 48 hours prior to surgery.  Do not bring valuables to the hospital.  Contacts, dentures or bridgework may not be worn into surgery.  Leave suitcase in the car. After surgery it may be brought to your room.  For patients admitted to the hospital, checkout time is 11:00 AM the day of discharge.   Patients discharged the day of surgery will not be allowed to drive home.  :     Please read over the following fact sheets that you were given: Coughing and Deep Breathing, Surgical Site Infection Prevention, Anesthesia Post-op Instructions and Care and Recovery After Surgery    Cataract A cataract is a clouding of the lens of the eye. When a lens becomes cloudy, vision is reduced based on the degree and nature of the clouding. Many cataracts reduce vision to some degree. Some cataracts make people more near-sighted as they develop. Other cataracts increase glare. Cataracts that are ignored and become worse can sometimes look white. The white color can be seen through the pupil. CAUSES   Aging. However, cataracts may occur at any age, even in newborns.   Certain drugs.   Trauma to the eye.   Certain diseases such as diabetes.   Specific eye diseases such as chronic inflammation inside the eye or a sudden attack of a rare form of glaucoma.   Inherited or acquired medical problems.  SYMPTOMS   Gradual, progressive drop in vision in the affected eye.   Severe, rapid visual loss. This most often happens when trauma is the cause.  DIAGNOSIS  To detect a cataract, an eye doctor  examines the lens. Cataracts are best diagnosed with an exam of the eyes with the pupils enlarged (dilated) by drops.  TREATMENT  For an early cataract, vision may improve by using different eyeglasses or stronger lighting. If that does not help your vision, surgery is the only effective treatment. A cataract needs to be surgically removed when vision loss interferes with your everyday activities, such as driving, reading, or watching TV. A cataract may also have to be removed if it prevents examination or treatment of another eye problem. Surgery removes the cloudy lens and usually replaces it with a substitute lens (intraocular lens, IOL).  At a time when both you and your doctor agree, the cataract will be surgically removed. If you have cataracts in both eyes, only one is usually removed at a time. This allows the operated eye to heal and be out of danger from any possible problems after surgery (such as infection or poor wound healing). In rare cases, a cataract may be doing damage to your eye. In these cases, your caregiver may advise surgical removal right away. The vast majority of people who have cataract surgery have better vision afterward. HOME CARE INSTRUCTIONS  If you are not planning surgery, you may be asked to do the following:  Use different eyeglasses.   Use stronger or brighter lighting.   Ask your eye doctor about reducing your medicine dose or  changing medicines if it is thought that a medicine caused your cataract. Changing medicines does not make the cataract go away on its own.   Become familiar with your surroundings. Poor vision can lead to injury. Avoid bumping into things on the affected side. You are at a higher risk for tripping or falling.   Exercise extreme care when driving or operating machinery.   Wear sunglasses if you are sensitive to bright light or experiencing problems with glare.  SEEK IMMEDIATE MEDICAL CARE IF:   You have a worsening or sudden vision  loss.   You notice redness, swelling, or increasing pain in the eye.   You have a fever.  Document Released: 05/22/2005 Document Revised: 05/11/2011 Document Reviewed: 01/13/2011 [facility] Patient Information 2012 [place].PATIENT INSTRUCTIONS POST-ANESTHESIA  IMMEDIATELY FOLLOWING SURGERY:  Do not drive or operate machinery for the first twenty four hours after surgery.  Do not make any important decisions for twenty four hours after surgery or while taking narcotic pain medications or sedatives.  If you develop intractable nausea and vomiting or a severe headache please notify your doctor immediately.  FOLLOW-UP:  Please make an appointment with your surgeon as instructed. You do not need to follow up with anesthesia unless specifically instructed to do so.  WOUND CARE INSTRUCTIONS (if applicable):  Keep a dry clean dressing on the anesthesia/puncture wound site if there is drainage.  Once the wound has quit draining you may leave it open to air.  Generally you should leave the bandage intact for twenty four hours unless there is drainage.  If the epidural site drains for more than 36-48 hours please call the anesthesia department.  QUESTIONS?:  Please feel free to call your physician or the hospital operator if you have any questions, and they will be happy to assist you.

## 2016-03-08 ENCOUNTER — Other Ambulatory Visit: Payer: Self-pay

## 2016-03-08 ENCOUNTER — Encounter (HOSPITAL_COMMUNITY): Payer: Self-pay

## 2016-03-08 ENCOUNTER — Encounter (HOSPITAL_COMMUNITY)
Admission: RE | Admit: 2016-03-08 | Discharge: 2016-03-08 | Disposition: A | Discharge status: Home / Self Care | Payer: Medicare Other | Source: Ambulatory Visit | Attending: Ophthalmology | Admitting: Ophthalmology

## 2016-03-08 DIAGNOSIS — Z01812 Encounter for preprocedural laboratory examination: Secondary | ICD-10-CM | POA: Diagnosis not present

## 2016-03-08 DIAGNOSIS — Z01818 Encounter for other preprocedural examination: Secondary | ICD-10-CM | POA: Diagnosis not present

## 2016-03-08 DIAGNOSIS — H25811 Combined forms of age-related cataract, right eye: Secondary | ICD-10-CM | POA: Insufficient documentation

## 2016-03-08 DIAGNOSIS — R9431 Abnormal electrocardiogram [ECG] [EKG]: Secondary | ICD-10-CM | POA: Diagnosis not present

## 2016-03-08 HISTORY — DX: Hypothyroidism, unspecified: E03.9

## 2016-03-08 HISTORY — DX: Gastro-esophageal reflux disease without esophagitis: K21.9

## 2016-03-08 LAB — BASIC METABOLIC PANEL
ANION GAP: 5 (ref 5–15)
BUN: 21 mg/dL — AB (ref 6–20)
CALCIUM: 9.6 mg/dL (ref 8.9–10.3)
CO2: 28 mmol/L (ref 22–32)
CREATININE: 1.13 mg/dL (ref 0.61–1.24)
Chloride: 104 mmol/L (ref 101–111)
GFR calc non Af Amer: 57 mL/min — ABNORMAL LOW (ref >60)
GLUCOSE: 101 mg/dL — AB (ref 65–99)
POTASSIUM: 4.5 mmol/L (ref 3.5–5.1)
Sodium: 137 mmol/L (ref 135–145)

## 2016-03-08 LAB — CBC WITH DIFFERENTIAL/PLATELET
BASOS ABS: 0.1 10*3/uL (ref 0.0–0.1)
BASOS PCT: 2 %
Eosinophils Absolute: 0.2 10*3/uL (ref 0.0–0.7)
Eosinophils Relative: 3 %
HEMATOCRIT: 48.7 % (ref 39.0–52.0)
Hemoglobin: 17 g/dL (ref 13.0–17.0)
Lymphocytes Relative: 23 %
Lymphs Abs: 1.3 10*3/uL (ref 0.7–4.0)
MCH: 31.3 pg (ref 26.0–34.0)
MCHC: 34.9 g/dL (ref 30.0–36.0)
MCV: 89.7 fL (ref 78.0–100.0)
MONO ABS: 0.5 10*3/uL (ref 0.1–1.0)
Monocytes Relative: 9 %
NEUTROS ABS: 3.6 10*3/uL (ref 1.7–7.7)
NEUTROS PCT: 63 %
Platelets: 207 10*3/uL (ref 150–400)
RBC: 5.43 MIL/uL (ref 4.22–5.81)
RDW: 13.7 % (ref 11.5–15.5)
WBC: 5.7 10*3/uL (ref 4.0–10.5)

## 2016-03-13 ENCOUNTER — Ambulatory Visit (HOSPITAL_COMMUNITY)
Admission: RE | Admit: 2016-03-13 | Discharge: 2016-03-13 | Disposition: A | Discharge status: Home / Self Care | Payer: Medicare Other | Source: Ambulatory Visit | Attending: Ophthalmology | Admitting: Ophthalmology

## 2016-03-13 ENCOUNTER — Encounter (HOSPITAL_COMMUNITY)
Admission: RE | Disposition: A | Discharge status: Home / Self Care | Payer: Self-pay | Source: Ambulatory Visit | Attending: Ophthalmology

## 2016-03-13 ENCOUNTER — Ambulatory Visit (HOSPITAL_COMMUNITY): Payer: Medicare Other | Admitting: Anesthesiology

## 2016-03-13 ENCOUNTER — Encounter (HOSPITAL_COMMUNITY): Payer: Self-pay | Admitting: *Deleted

## 2016-03-13 DIAGNOSIS — I10 Essential (primary) hypertension: Secondary | ICD-10-CM | POA: Insufficient documentation

## 2016-03-13 DIAGNOSIS — Z87891 Personal history of nicotine dependence: Secondary | ICD-10-CM | POA: Diagnosis not present

## 2016-03-13 DIAGNOSIS — E039 Hypothyroidism, unspecified: Secondary | ICD-10-CM | POA: Diagnosis not present

## 2016-03-13 DIAGNOSIS — H25811 Combined forms of age-related cataract, right eye: Secondary | ICD-10-CM | POA: Diagnosis not present

## 2016-03-13 DIAGNOSIS — H2589 Other age-related cataract: Secondary | ICD-10-CM | POA: Insufficient documentation

## 2016-03-13 DIAGNOSIS — K219 Gastro-esophageal reflux disease without esophagitis: Secondary | ICD-10-CM | POA: Insufficient documentation

## 2016-03-13 DIAGNOSIS — H269 Unspecified cataract: Secondary | ICD-10-CM | POA: Diagnosis not present

## 2016-03-13 DIAGNOSIS — H2511 Age-related nuclear cataract, right eye: Secondary | ICD-10-CM | POA: Diagnosis not present

## 2016-03-13 HISTORY — PX: CATARACT EXTRACTION W/PHACO: SHX586

## 2016-03-13 SURGERY — PHACOEMULSIFICATION, CATARACT, WITH IOL INSERTION
Anesthesia: Monitor Anesthesia Care | Site: Eye | Laterality: Right

## 2016-03-13 MED ORDER — LIDOCAINE HCL 3.5 % OP GEL
1.0000 "application " | Freq: Once | OPHTHALMIC | Status: AC
  Administered 2016-03-13: 1 via OPHTHALMIC

## 2016-03-13 MED ORDER — FENTANYL CITRATE (PF) 100 MCG/2ML IJ SOLN
25.0000 ug | INTRAMUSCULAR | Status: AC | PRN
  Administered 2016-03-13 (×2): 25 ug via INTRAVENOUS

## 2016-03-13 MED ORDER — LIDOCAINE HCL (PF) 1 % IJ SOLN
INTRAOCULAR | Status: DC | PRN
  Administered 2016-03-13: .9 mL via OPHTHALMIC

## 2016-03-13 MED ORDER — NEOMYCIN-POLYMYXIN-DEXAMETH 3.5-10000-0.1 OP SUSP
OPHTHALMIC | Status: DC | PRN
  Administered 2016-03-13: 2 [drp] via OPHTHALMIC

## 2016-03-13 MED ORDER — POVIDONE-IODINE 5 % OP SOLN
OPHTHALMIC | Status: DC | PRN
  Administered 2016-03-13: 1 via OPHTHALMIC

## 2016-03-13 MED ORDER — FENTANYL CITRATE (PF) 100 MCG/2ML IJ SOLN
INTRAMUSCULAR | Status: AC
  Filled 2016-03-13: qty 2

## 2016-03-13 MED ORDER — NA HYALUR & NA CHOND-NA HYALUR 0.55-0.5 ML IO KIT
PACK | INTRAOCULAR | Status: DC | PRN
  Administered 2016-03-13: 1 via OPHTHALMIC

## 2016-03-13 MED ORDER — CYCLOPENTOLATE-PHENYLEPHRINE 0.2-1 % OP SOLN
1.0000 [drp] | OPHTHALMIC | Status: AC
  Administered 2016-03-13 (×3): 1 [drp] via OPHTHALMIC

## 2016-03-13 MED ORDER — LACTATED RINGERS IV SOLN
INTRAVENOUS | Status: DC
  Administered 2016-03-13: 10:00:00 via INTRAVENOUS

## 2016-03-13 MED ORDER — MIDAZOLAM HCL 2 MG/2ML IJ SOLN
INTRAMUSCULAR | Status: AC
  Filled 2016-03-13: qty 2

## 2016-03-13 MED ORDER — EPINEPHRINE HCL 1 MG/ML IJ SOLN
INTRAMUSCULAR | Status: AC
  Filled 2016-03-13: qty 1

## 2016-03-13 MED ORDER — BSS IO SOLN
INTRAOCULAR | Status: DC | PRN
  Administered 2016-03-13: 15 mL

## 2016-03-13 MED ORDER — PHENYLEPHRINE HCL 2.5 % OP SOLN
1.0000 [drp] | OPHTHALMIC | Status: AC
  Administered 2016-03-13 (×3): 1 [drp] via OPHTHALMIC

## 2016-03-13 MED ORDER — TETRACAINE HCL 0.5 % OP SOLN
1.0000 [drp] | OPHTHALMIC | Status: AC
  Administered 2016-03-13 (×3): 1 [drp] via OPHTHALMIC

## 2016-03-13 MED ORDER — MIDAZOLAM HCL 2 MG/2ML IJ SOLN
1.0000 mg | INTRAMUSCULAR | Status: DC | PRN
  Administered 2016-03-13: 2 mg via INTRAVENOUS

## 2016-03-13 MED ORDER — EPINEPHRINE HCL 1 MG/ML IJ SOLN
INTRAMUSCULAR | Status: DC | PRN
  Administered 2016-03-13: 500 mL

## 2016-03-13 SURGICAL SUPPLY — 14 items
CLOTH BEACON ORANGE TIMEOUT ST (SAFETY) ×2 IMPLANT
EYE SHIELD UNIVERSAL CLEAR (GAUZE/BANDAGES/DRESSINGS) ×2 IMPLANT
GLOVE BIOGEL PI IND STRL 6.5 (GLOVE) IMPLANT
GLOVE BIOGEL PI IND STRL 7.0 (GLOVE) IMPLANT
GLOVE BIOGEL PI INDICATOR 6.5 (GLOVE) ×2
GLOVE BIOGEL PI INDICATOR 7.0 (GLOVE) ×2
GLOVE EXAM NITRILE MD LF STRL (GLOVE) ×2 IMPLANT
PAD ARMBOARD 7.5X6 YLW CONV (MISCELLANEOUS) ×2 IMPLANT
SIGHTPATH CAT PROC W REG LENS (Ophthalmic Related) ×3 IMPLANT
SYR 5ML LL (SYRINGE) ×2 IMPLANT
SYRINGE LUER LOK 1CC (MISCELLANEOUS) ×2 IMPLANT
TAPE SURG TRANSPORE 1 IN (GAUZE/BANDAGES/DRESSINGS) IMPLANT
TAPE SURGICAL TRANSPORE 1 IN (GAUZE/BANDAGES/DRESSINGS) ×2
WATER STERILE IRR 250ML POUR (IV SOLUTION) ×2 IMPLANT

## 2016-03-13 NOTE — H&P (Signed)
I have reviewed the H&P, the patient was re-examined, and I have identified no interval changes in medical condition and plan of care since the history and physical of record  

## 2016-03-13 NOTE — Anesthesia Postprocedure Evaluation (Signed)
  Anesthesia Post-op Note  Patient: [name]  Procedure(s) Performed: Procedure(s) (LRB): CATARACT EXTRACTION PHACO AND INTRAOCULAR LENS PLACEMENT (IOC) (Right)  Patient Location:  Short Stay  Anesthesia Type: MAC  Level of Consciousness: awake  Airway and Oxygen Therapy: Patient Spontanous Breathing  Post-op Pain: none  Post-op Assessment: Post-op Vital signs reviewed, Patient's Cardiovascular Status Stable, Respiratory Function Stable, Patent Airway, No signs of Nausea or vomiting and Pain level controlled  Post-op Vital Signs: Reviewed and stable  Complications: No apparent anesthesia complications Anesthesia Post Note  Patient: Aaron Salazar  Procedure(s) Performed: Procedure(s) (LRB): CATARACT EXTRACTION PHACO AND INTRAOCULAR LENS PLACEMENT (IOC) (Right)  Anesthesia Post Evaluation  Last Vitals:  Vitals:   03/13/16 1040 03/13/16 1045  BP: (!) 101/58 (!) 113/58  Pulse:    Resp: 15 13  Temp:      Last Pain:  Vitals:   03/13/16 0935  TempSrc: Oral                 Cyndi Montejano

## 2016-03-13 NOTE — Anesthesia Procedure Notes (Signed)
Procedure Name: MAC Date/Time: 03/13/2016 10:49 AM Performed by: Vista Deck Pre-anesthesia Checklist: Patient identified, Emergency Drugs available, Suction available, Timeout performed and Patient being monitored Patient Re-evaluated:Patient Re-evaluated prior to inductionOxygen Delivery Method: Nasal Cannula

## 2016-03-13 NOTE — Transfer of Care (Signed)
Immediate Anesthesia Transfer of Care Note  Patient: Aaron Salazar  Procedure(s) Performed: Procedure(s) (LRB): CATARACT EXTRACTION PHACO AND INTRAOCULAR LENS PLACEMENT (IOC) (Right)  Patient Location: Shortstay  Anesthesia Type: MAC  Level of Consciousness: awake  Airway & Oxygen Therapy: Patient Spontanous Breathing   Post-op Assessment: Report given to PACU RN, Post -op Vital signs reviewed and stable and Patient moving all extremities  Post vital signs: Reviewed and stable  Complications: No apparent anesthesia complications

## 2016-03-13 NOTE — Discharge Instructions (Signed)

## 2016-03-13 NOTE — Anesthesia Preprocedure Evaluation (Signed)
Anesthesia Evaluation  Patient identified by MRN, date of birth, ID band Patient awake    Reviewed: Allergy & Precautions, NPO status , Patient's Chart, lab work & pertinent test results  Airway Mallampati: II  TM Distance: >3 FB     Dental  (+) Edentulous Upper, Edentulous Lower   Pulmonary former smoker,    breath sounds clear to auscultation       Cardiovascular hypertension, Pt. on medications  Rhythm:Regular Rate:Normal     Neuro/Psych    GI/Hepatic GERD  ,  Endo/Other  Hypothyroidism   Renal/GU      Musculoskeletal   Abdominal   Peds  Hematology   Anesthesia Other Findings   Reproductive/Obstetrics                             Anesthesia Physical Anesthesia Plan  ASA: III  Anesthesia Plan: MAC   Post-op Pain Management:    Induction: Intravenous  Airway Management Planned: Nasal Cannula  Additional Equipment:   Intra-op Plan:   Post-operative Plan:   Informed Consent: I have reviewed the patients History and Physical, chart, labs and discussed the procedure including the risks, benefits and alternatives for the proposed anesthesia with the patient or authorized representative who has indicated his/her understanding and acceptance.     Plan Discussed with:   Anesthesia Plan Comments:         Anesthesia Quick Evaluation

## 2016-03-13 NOTE — Op Note (Signed)
Date of Admission: 03/13/2016  Date of Surgery: 03/13/2016   Pre-Op Dx: Cataract Right Eye  Post-Op Dx: Senile Combined Cataract Right  Eye,  Dx Code N30.051  Surgeon: Tonny Branch, M.D.  Assistants: None  Anesthesia: Topical with MAC  Indications: Painless, progressive loss of vision with compromise of daily activities.  Surgery: Cataract Extraction with Intraocular lens Implant Right Eye  Discription: The patient had dilating drops and viscous lidocaine placed into the Right eye in the pre-op holding area. After transfer to the operating room, a time out was performed. The patient was then prepped and draped. Beginning with a 8 degree blade a paracentesis port was made at the surgeon's 2 o'clock position. The anterior chamber was then filled with 1% non-preserved lidocaine. This was followed by filling the anterior chamber with Viscoat.  A 2.58m keratome blade was used to make a clear corneal incision at the temporal limbus.  A bent cystatome needle was used to create a continuous tear capsulotomy. Hydrodissection was performed with balanced salt solution on a Fine canula. The lens nucleus was then removed using the phacoemulsification handpiece. Residual cortex was removed with the I&A handpiece. The anterior chamber and capsular bag were refilled with Provisc. A posterior chamber intraocular lens was placed into the capsular bag with it's injector. The implant was positioned with the Kuglan hook. The Provisc was then removed from the anterior chamber and capsular bag with the I&A handpiece. Stromal hydration of the main incision and paracentesis port was performed with BSS on a Fine canula. The wounds were tested for leak which was negative. The patient tolerated the procedure well. There were no operative complications. The patient was then transferred to the recovery room in stable condition.  Complications: None  Specimen: None  EBL: None  Prosthetic device: Hoya iSert 250, power 19.5  D, SN [id number]

## 2016-03-16 ENCOUNTER — Encounter (HOSPITAL_COMMUNITY): Payer: Self-pay | Admitting: Ophthalmology

## 2016-04-10 DIAGNOSIS — H1851 Endothelial corneal dystrophy: Secondary | ICD-10-CM | POA: Diagnosis not present

## 2016-04-10 DIAGNOSIS — H5203 Hypermetropia, bilateral: Secondary | ICD-10-CM | POA: Diagnosis not present

## 2016-04-10 DIAGNOSIS — H524 Presbyopia: Secondary | ICD-10-CM | POA: Diagnosis not present

## 2016-04-10 DIAGNOSIS — H52221 Regular astigmatism, right eye: Secondary | ICD-10-CM | POA: Diagnosis not present

## 2016-04-10 DIAGNOSIS — Z961 Presence of intraocular lens: Secondary | ICD-10-CM | POA: Diagnosis not present

## 2016-06-23 ENCOUNTER — Ambulatory Visit: Payer: Medicare Other | Admitting: Family Medicine

## 2016-07-19 ENCOUNTER — Other Ambulatory Visit: Payer: Self-pay | Admitting: Family Medicine

## 2016-12-21 ENCOUNTER — Other Ambulatory Visit: Payer: Self-pay | Admitting: Family Medicine

## 2016-12-21 NOTE — Telephone Encounter (Signed)
NA / NVM

## 2016-12-21 NOTE — Telephone Encounter (Signed)
Last thyroid level 07/20/15  Dr Sabra Heck

## 2016-12-21 NOTE — Telephone Encounter (Signed)
Needs TSH. Would give him enough of the medicine at same dose to last for 1 week. That should give him time to get in for TSH

## 2017-01-15 ENCOUNTER — Encounter: Payer: Self-pay | Admitting: Family Medicine

## 2017-01-15 ENCOUNTER — Ambulatory Visit (INDEPENDENT_AMBULATORY_CARE_PROVIDER_SITE_OTHER): Payer: Medicare Other | Admitting: Family Medicine

## 2017-01-15 VITALS — BP 142/84 | HR 91 | Temp 98.2°F | Ht 69.0 in | Wt 178.2 lb

## 2017-01-15 DIAGNOSIS — E785 Hyperlipidemia, unspecified: Secondary | ICD-10-CM | POA: Diagnosis not present

## 2017-01-15 DIAGNOSIS — E039 Hypothyroidism, unspecified: Secondary | ICD-10-CM | POA: Diagnosis not present

## 2017-01-15 DIAGNOSIS — I10 Essential (primary) hypertension: Secondary | ICD-10-CM

## 2017-01-15 MED ORDER — LEVOTHYROXINE SODIUM 50 MCG PO TABS: ORAL_TABLET | ORAL | 3 refills | Status: AC

## 2017-01-15 MED ORDER — LEVOTHYROXINE SODIUM 50 MCG PO TABS: ORAL_TABLET | ORAL | 3 refills | Status: DC

## 2017-01-15 NOTE — Patient Instructions (Signed)
Great to meet you!  Come back in 1 year unless you need [place] sooner.

## 2017-01-15 NOTE — Progress Notes (Signed)
   HPI  Patient presents today to establish care, his previous PCP has retired, also review chronic medical conditions.  Hypothyroidism Good medication compliance, patient has used his last pill today and needs a refill. Asymptomatic  Anemia Previously taking a statin, no history of ASCVD. Patient had a discussion with his previous PCP and they have decided to stop statins given his age. He still wants to continue no medication for cholesterol.  Hypertension No chest pain, dyspnea, palpitations, leg edema No medications.   PMH: Smoking status noted ROS: Per HPI  Objective: BP (!) 142/84   Pulse 91   Temp 98.2 F (36.8 C) (Oral)   Ht _0  (1.753 m)   Wt 178 lb 3.2 oz (80.8 kg)   BMI 26.32 kg/m  Gen: NAD, alert, cooperative with exam HEENT: NCAT, EOMI, PERRL CV: RRR, good S1/S2, no murmur Resp: CTABL, no wheezes, non-labored Abd: SNTND, BS present, no guarding or organomegaly Ext: No edema, warm Neuro: Alert and oriented, No gross deficits  Assessment and plan:  # Hypothyroidism Refilled medication, asymptomatic, stable Repeat TSH  # Hypertension Well-controlled with diet, no medications necessary  # Hyperlipidemia Previously LDL very elevated, given age and lack of ASCVD patient and his previous PCP have decided not to pursue statins. This is understandable, continue deferring statins Checking labs today, however will not likely start medication.   Orders Placed This Encounter  Procedures  . CMP14+EGFR  . CBC with Differential/Platelet  . Lipid panel  . TSH    Meds ordered this encounter  Medications  . DISCONTD: levothyroxine (SYNTHROID, LEVOTHROID) 50 MCG tablet    Sig: TAKE 1 TABLET (50 MCG TOTAL) BY MOUTH DAILY BEFORE BREAKFAST.    Dispense:  90 tablet    Refill:  3  . levothyroxine (SYNTHROID, LEVOTHROID) 50 MCG tablet    Sig: TAKE 1 TABLET (50 MCG TOTAL) BY MOUTH DAILY BEFORE BREAKFAST.    Dispense:  90 tablet    Refill:  [place], MD [place] 01/15/2017, 3:40 PM

## 2017-01-16 LAB — CBC WITH DIFFERENTIAL/PLATELET
BASOS: 1 %
Basophils Absolute: 0.1 10*3/uL (ref 0.0–0.2)
EOS (ABSOLUTE): 0.5 10*3/uL — ABNORMAL HIGH (ref 0.0–0.4)
EOS: 8 %
HEMATOCRIT: 48.9 % (ref 37.5–51.0)
Hemoglobin: 16.7 g/dL (ref 13.0–17.7)
IMMATURE GRANULOCYTES: 0 %
Immature Grans (Abs): 0 10*3/uL (ref 0.0–0.1)
LYMPHS ABS: 1.6 10*3/uL (ref 0.7–3.1)
Lymphs: 27 %
MCH: 31.1 pg (ref 26.6–33.0)
MCHC: 34.2 g/dL (ref 31.5–35.7)
MCV: 91 fL (ref 79–97)
MONOS ABS: 0.5 10*3/uL (ref 0.1–0.9)
Monocytes: 9 %
NEUTROS ABS: 3.3 10*3/uL (ref 1.4–7.0)
NEUTROS PCT: 55 %
Platelets: 290 10*3/uL (ref 150–379)
RBC: 5.37 x10E6/uL (ref 4.14–5.80)
RDW: 14.1 % (ref 12.3–15.4)
WBC: 5.9 10*3/uL (ref 3.4–10.8)

## 2017-01-16 LAB — CMP14+EGFR
A/G RATIO: 1.5 (ref 1.2–2.2)
ALBUMIN: 4.1 g/dL (ref 3.5–4.7)
ALT: 8 IU/L (ref 0–44)
AST: 21 IU/L (ref 0–40)
Alkaline Phosphatase: 107 IU/L (ref 39–117)
BUN / CREAT RATIO: 12 (ref 10–24)
BUN: 14 mg/dL (ref 8–27)
Bilirubin Total: 0.4 mg/dL (ref 0.0–1.2)
CALCIUM: 9.1 mg/dL (ref 8.6–10.2)
CO2: 24 mmol/L (ref 20–29)
CREATININE: 1.17 mg/dL (ref 0.76–1.27)
Chloride: 105 mmol/L (ref 96–106)
GFR, EST AFRICAN AMERICAN: 65 mL/min/{1.73_m2} (ref >59)
GFR, EST NON AFRICAN AMERICAN: 56 mL/min/{1.73_m2} — AB (ref >59)
GLOBULIN, TOTAL: 2.7 g/dL (ref 1.5–4.5)
Glucose: 99 mg/dL (ref 65–99)
POTASSIUM: 4.7 mmol/L (ref 3.5–5.2)
SODIUM: 142 mmol/L (ref 134–144)
TOTAL PROTEIN: 6.8 g/dL (ref 6.0–8.5)

## 2017-01-16 LAB — LIPID PANEL
CHOL/HDL RATIO: 9.7 ratio — AB (ref 0.0–5.0)
Cholesterol, Total: 262 mg/dL — ABNORMAL HIGH (ref 100–199)
HDL: 27 mg/dL — AB (ref >39)
LDL Calculated: 159 mg/dL — ABNORMAL HIGH (ref 0–99)
Triglycerides: 378 mg/dL — ABNORMAL HIGH (ref 0–149)
VLDL Cholesterol Cal: 76 mg/dL — ABNORMAL HIGH (ref 5–40)

## 2017-01-16 LAB — TSH: TSH: 4.12 u[IU]/mL (ref 0.450–4.500)

## 2017-01-29 ENCOUNTER — Encounter: Payer: Self-pay | Admitting: *Deleted

## 2017-05-09 ENCOUNTER — Encounter (HOSPITAL_COMMUNITY): Payer: Self-pay | Admitting: Emergency Medicine

## 2017-05-09 ENCOUNTER — Other Ambulatory Visit: Payer: Self-pay

## 2017-05-09 ENCOUNTER — Ambulatory Visit (INDEPENDENT_AMBULATORY_CARE_PROVIDER_SITE_OTHER): Payer: Medicare Other | Admitting: Family Medicine

## 2017-05-09 ENCOUNTER — Emergency Department (HOSPITAL_COMMUNITY)
Admission: EM | Admit: 2017-05-09 | Discharge: 2017-05-09 | Disposition: A | Discharge status: Home / Self Care | Payer: Medicare Other | Attending: Emergency Medicine | Admitting: Emergency Medicine

## 2017-05-09 VITALS — BP 138/89 | HR 140 | Temp 97.0°F | Ht 69.0 in | Wt 179.0 lb

## 2017-05-09 DIAGNOSIS — R55 Syncope and collapse: Secondary | ICD-10-CM | POA: Insufficient documentation

## 2017-05-09 DIAGNOSIS — R000 Tachycardia, unspecified: Secondary | ICD-10-CM

## 2017-05-09 DIAGNOSIS — I10 Essential (primary) hypertension: Secondary | ICD-10-CM | POA: Diagnosis not present

## 2017-05-09 DIAGNOSIS — I951 Orthostatic hypotension: Secondary | ICD-10-CM | POA: Diagnosis not present

## 2017-05-09 DIAGNOSIS — Z87891 Personal history of nicotine dependence: Secondary | ICD-10-CM | POA: Insufficient documentation

## 2017-05-09 DIAGNOSIS — E039 Hypothyroidism, unspecified: Secondary | ICD-10-CM | POA: Diagnosis not present

## 2017-05-09 DIAGNOSIS — Z79899 Other long term (current) drug therapy: Secondary | ICD-10-CM | POA: Insufficient documentation

## 2017-05-09 LAB — CBC WITH DIFFERENTIAL/PLATELET
Basophils Absolute: 0.1 10*3/uL (ref 0.0–0.1)
Basophils Relative: 2 %
Eosinophils Absolute: 0.4 10*3/uL (ref 0.0–0.7)
Eosinophils Relative: 6 %
HCT: 50.6 % (ref 39.0–52.0)
HEMOGLOBIN: 16.9 g/dL (ref 13.0–17.0)
LYMPHS ABS: 1 10*3/uL (ref 0.7–4.0)
LYMPHS PCT: 15 %
MCH: 30.3 pg (ref 26.0–34.0)
MCHC: 33.4 g/dL (ref 30.0–36.0)
MCV: 90.8 fL (ref 78.0–100.0)
MONO ABS: 0.6 10*3/uL (ref 0.1–1.0)
MONOS PCT: 8 %
NEUTROS ABS: 4.7 10*3/uL (ref 1.7–7.7)
NEUTROS PCT: 69 %
Platelets: 247 10*3/uL (ref 150–400)
RBC: 5.57 MIL/uL (ref 4.22–5.81)
RDW: 13.6 % (ref 11.5–15.5)
WBC: 6.9 10*3/uL (ref 4.0–10.5)

## 2017-05-09 LAB — COMPREHENSIVE METABOLIC PANEL
ALBUMIN: 3.9 g/dL (ref 3.5–5.0)
ALK PHOS: 110 U/L (ref 38–126)
ALT: 12 U/L — ABNORMAL LOW (ref 17–63)
ANION GAP: 6 (ref 5–15)
AST: 21 U/L (ref 15–41)
BUN: 17 mg/dL (ref 6–20)
CALCIUM: 9 mg/dL (ref 8.9–10.3)
CO2: 27 mmol/L (ref 22–32)
Chloride: 103 mmol/L (ref 101–111)
Creatinine, Ser: 1.21 mg/dL (ref 0.61–1.24)
GFR calc Af Amer: >60 mL/min (ref >60)
GFR calc non Af Amer: 52 mL/min — ABNORMAL LOW (ref >60)
GLUCOSE: 100 mg/dL — AB (ref 65–99)
Potassium: 4.3 mmol/L (ref 3.5–5.1)
SODIUM: 136 mmol/L (ref 135–145)
Total Bilirubin: 0.8 mg/dL (ref 0.3–1.2)
Total Protein: 7.4 g/dL (ref 6.5–8.1)

## 2017-05-09 LAB — TROPONIN I: Troponin I: <0.03 ng/mL (ref <0.03)

## 2017-05-09 NOTE — Discharge Instructions (Signed)
Your passing out may have been due to standing up too fast.  Will follow up with Dr. Wendi Snipes for further management.  There are cardiac monitoring and blood work has been reassuring here.

## 2017-05-09 NOTE — ED Triage Notes (Signed)
PT brought in by [facility] from [place] today bc of irregular Afib rate. PT states he went to his PCP today because he passed out yesterday and fell and hit the back of his head on the floor. PT alert and oriented upon arrival to ED and states has hx of afib.

## 2017-05-09 NOTE — ED Notes (Signed)
Patient denies any dizziness sitting or standing.

## 2017-05-09 NOTE — ED Provider Notes (Signed)
[facility] EMERGENCY DEPARTMENT Provider Note   CSN: [id number] Arrival date & time: 05/09/17  1500     History   Chief Complaint Chief Complaint  Patient presents with  . Loss of Consciousness    HPI Aaron Salazar is a 83 y.o. male.  HPI Patient presents after a fall.  Yesterday he stood up after he was sitting and became lightheaded and passed out.  States he was out and hit the back of his head.  He has been doing well today.  No headaches.  No confusion.  No chest pain or trouble breathing.  Had a good appetite.  States he went to his primary care doctor and was told he may need to come in the hospital for monitoring. Past Medical History:  Diagnosis Date  . GERD (gastroesophageal reflux disease)   . History of shingles 09-2012   shingles above eye  . Hyperlipidemia   . Hypertension   . Hypothyroidism   . Thyroid disease     Patient Active Problem List   Diagnosis Date Noted  . Hypothyroidism 10/06/2010  . HTN (hypertension) 10/06/2010  . Hyperlipidemia 10/06/2010  . Vitamin D deficiency 10/06/2010    Past Surgical History:  Procedure Laterality Date  . CATARACT EXTRACTION W/PHACO Right 03/13/2016   Procedure: CATARACT EXTRACTION PHACO AND INTRAOCULAR LENS PLACEMENT (IOC);  Surgeon: Tonny Branch, MD;  Location: [facility];  Service: Ophthalmology;  Laterality: Right;  CDE: 19.82  . CHOLECYSTECTOMY    . duprens    . TONSILLECTOMY AND ADENOIDECTOMY Bilateral 1940       Home Medications    Prior to Admission medications   Medication Sig Start Date End Date Taking? Authorizing Provider  levothyroxine (SYNTHROID, LEVOTHROID) 50 MCG tablet TAKE 1 TABLET (50 MCG TOTAL) BY MOUTH DAILY BEFORE BREAKFAST. 01/15/17  Yes Timmothy Euler, MD    Family History Family History  Problem Relation Age of Onset  . Mental retardation Son     Social History Social History   Tobacco Use  . Smoking status: Former Smoker    Last attempt to quit: 08/31/1992    Years since  quitting: 24.7  . Smokeless tobacco: Never Used  Substance Use Topics  . Alcohol use: No  . Drug use: No     Allergies   Patient has no known allergies.   Review of Systems Review of Systems  Constitutional: Negative for appetite change and fever.  HENT: Negative for congestion.   Respiratory: Negative for shortness of breath.   Cardiovascular: Negative for chest pain and palpitations.  Gastrointestinal: Negative for abdominal pain.  Genitourinary: Negative for flank pain.  Musculoskeletal: Negative for back pain.  Neurological: Positive for syncope. Negative for numbness.  Hematological: Negative for adenopathy.  Psychiatric/Behavioral: Negative for confusion.     Physical Exam Updated Vital Signs BP (!) 180/91   Pulse 97   Temp 98.3 F (36.8 C) (Oral)   Resp 14   Ht 5\' 9"  (1.753 m)   Wt 81.2 kg (179 lb)   SpO2 96%   BMI 26.43 kg/m   Physical Exam  Constitutional: He appears well-developed.  HENT:  Head: Atraumatic.  Eyes: EOM are normal.  Neck: Neck supple.  Cardiovascular: Normal rate.  Pulmonary/Chest: Effort normal.  Abdominal: Soft. There is no tenderness.  Musculoskeletal: He exhibits no tenderness.  Neurological: He is alert.  Skin: Skin is warm. Capillary refill takes less than 2 seconds.  Psychiatric: He has a normal mood and affect.     ED  Treatments / Results  Labs (all labs ordered are listed, but only abnormal results are displayed) Labs Reviewed  COMPREHENSIVE METABOLIC PANEL - Abnormal; Notable for the following components:      Result Value   Glucose, Bld 100 (*)    ALT 12 (*)    GFR calc non Af Amer 52 (*)    All other components within normal limits  CBC WITH DIFFERENTIAL/PLATELET  TROPONIN I    EKG  EKG Interpretation  Date/Time:  Wednesday May 09 2017 15:05:06 EST Ventricular Rate:  88 PR Interval:    QRS Duration: 86 QT Interval:  348 QTC Calculation: 421 R Axis:   16 Text Interpretation:  Sinus rhythm  Prolonged PR interval Borderline low voltage, extremity leads Confirmed by Davonna Belling [phone]) on 05/09/2017 3:40:54 PM       Radiology No results found.  Procedures Procedures (including critical care time)  Medications Ordered in ED Medications - No data to display   Initial Impression / Assessment and Plan / ED Course  I have reviewed the triage vital signs and the nursing notes.  Pertinent labs & imaging results that were available during my care of the patient were reviewed by me and considered in my medical decision making (see chart for details).     Patient with syncopal episode yesterday.  Began after standing.  May be due to some orthostatic  changes.  Labs and EKG reassuring.  Benign exam.  I think it is reasonable for more of an outpatient workup for this.  Final Clinical Impressions(s) / ED Diagnoses   Final diagnoses:  Syncope, unspecified syncope type  Orthostatic hypotension    ED Discharge Orders    None       Davonna Belling, MD 05/09/17 [id number]

## 2017-05-09 NOTE — Progress Notes (Signed)
BP 138/89   Pulse (!) 140   Temp (!) 97 F (36.1 C) (Oral)   Ht 5\' 9"  (1.753 m)   Wt 179 lb (81.2 kg)   SpO2 98%   BMI 26.43 kg/m    Subjective:    Patient ID: Aaron Salazar, male    DOB: 03-Oct-1927, 83 y.o.   MRN: [id number]  HPI: Aaron Salazar is a 83 y.o. male presenting on 05/09/2017 for Passing out and Tachycardia   HPI Syncope/falling Syncope and palpitations that have been going on intermittently over the past few months where he has had presyncopal episodes but then yesterday he had a full-blown syncopal episode where he lost consciousness.  This was witnessed and he was not found to have any seizure-like activity but just slumped over and fell which caused some bruising on his arm but otherwise he has no pain from the actual fall.  Upon initial exam by nurse his heart rate was found to be in the 140s but on exam by me was lower in the EKG did not show this.  He did also hit the back of his head when he fell but has not had any other abnormalities since then and this occurred yesterday.  Relevant past medical, surgical, family and social history reviewed and updated as indicated. Interim medical history since our last visit reviewed. Allergies and medications reviewed and updated.  Review of Systems  Constitutional: Negative for chills and fever.  Respiratory: Negative for shortness of breath and wheezing.   Cardiovascular: Positive for palpitations. Negative for chest pain and leg swelling.  Gastrointestinal: Negative for abdominal pain.  Musculoskeletal: Negative for back pain and gait problem.  Skin: Negative for rash.  Neurological: Positive for dizziness, syncope and light-headedness. Negative for tremors, seizures, speech difficulty and weakness.  All other systems reviewed and are negative.   Per HPI unless specifically indicated above     Objective:    BP 138/89   Pulse (!) 140   Temp (!) 97 F (36.1 C) (Oral)   Ht 5\' 9"  (1.753 m)   Wt 179 lb (81.2  kg)   SpO2 98%   BMI 26.43 kg/m   Wt Readings from Last 3 Encounters:  05/09/17 179 lb (81.2 kg)  01/15/17 178 lb 3.2 oz (80.8 kg)  03/13/16 184 lb (83.5 kg)    Physical Exam  Constitutional: He is oriented to person, place, and time. He appears well-developed and well-nourished. No distress.  Eyes: Conjunctivae are normal. No scleral icterus.  Neck: Neck supple. No thyromegaly present.  Cardiovascular: Normal rate, regular rhythm, normal heart sounds and intact distal pulses.  No murmur heard. Pulmonary/Chest: Effort normal and breath sounds normal. No respiratory distress. He has no wheezes. He has no rales.  Musculoskeletal: Normal range of motion. He exhibits no edema.  Lymphadenopathy:    He has no cervical adenopathy.  Neurological: He is alert and oriented to person, place, and time. He displays normal reflexes. No cranial nerve deficit. He exhibits normal muscle tone. Coordination normal.  Skin: Skin is warm and dry. No rash noted. He is not diaphoretic.  Psychiatric: He has a normal mood and affect. His behavior is normal.  Nursing note and vitals reviewed.   EKG: Low voltage, normal sinus    Assessment & Plan:   Problem List Items Addressed This Visit    None    Visit Diagnoses    Tachycardia    -  Primary   Suspected A. fib, came in  in the 140s EKG, had syncopal episode and hit his head yesterday.  Unsafe to go home, will send to ED   Relevant Orders   EKG 12-Lead (Completed)   Syncope, unspecified syncope type          Return for patient flipping in and out of A. fib with RVR, will send to emergency department for evaluation for his heart monitoring, unable to catch it here in the office.  Follow up plan: Return if symptoms worsen or fail to improve.  Counseling provided for all of the vaccine components Orders Placed This Encounter  Procedures  . EKG 12-Lead    Caryl Pina, MD [facility] 05/09/2017, 1:56 PM

## 2017-05-09 NOTE — ED Notes (Signed)
Patient was ambulatory from the EMS stretcher to the bed without assistancel.

## 2017-06-15 ENCOUNTER — Other Ambulatory Visit: Payer: Self-pay | Admitting: Family Medicine

## 2017-06-15 ENCOUNTER — Ambulatory Visit (INDEPENDENT_AMBULATORY_CARE_PROVIDER_SITE_OTHER): Payer: Medicare Other | Admitting: Family Medicine

## 2017-06-15 ENCOUNTER — Encounter: Payer: Self-pay | Admitting: Family Medicine

## 2017-06-15 ENCOUNTER — Ambulatory Visit (INDEPENDENT_AMBULATORY_CARE_PROVIDER_SITE_OTHER): Payer: Medicare Other

## 2017-06-15 VITALS — BP 168/83 | HR 79 | Temp 96.4°F | Ht 69.0 in | Wt 172.2 lb

## 2017-06-15 DIAGNOSIS — M545 Low back pain, unspecified: Secondary | ICD-10-CM

## 2017-06-15 DIAGNOSIS — M542 Cervicalgia: Secondary | ICD-10-CM

## 2017-06-15 MED ORDER — TRIAMCINOLONE ACETONIDE 40 MG/ML IJ SUSP
40.0000 mg | Freq: Once | INTRAMUSCULAR | Status: AC
Start: 2017-06-15 — End: 2017-06-15
  Administered 2017-06-15: 40 mg via INTRAMUSCULAR

## 2017-06-15 NOTE — Patient Instructions (Signed)
Great to see you!  Try 2 extra strength tylenol up to 3 times a day.   Come back in 2-4 weeks if you are not feeling better.

## 2017-06-15 NOTE — Progress Notes (Signed)
   HPI  Patient presents today here with neck pain and low back pain.  Explains he has had neck pain for about 1 month and low back pain for about 1 week.  Patient explains that he has not had any accidents or previous severe pains like this.  He complains of left-sided neck pain that is worse with turning to the left.  It does not radiate. He does not have radiation down his arm. He has no arm weakness, numbness, or tingling.  Low back pain Primarily right-sided low back pain, when he is walking he also has bilateral thigh pain.  Believe this is muscular pain but rather an extension of his low back pain. He is tried Advil with not much improvement, he has not tried Tylenol  PMH: Smoking status noted ROS: Per HPI  Objective: BP (!) 168/83   Pulse 79   Temp (!) 96.4 F (35.8 C) (Oral)   Ht 5\' 9"  (1.753 m)   Wt 172 lb 3.2 oz (78.1 kg)   BMI 25.43 kg/m  Gen: NAD, alert, cooperative with exam HEENT: NCAT CV: RRR, good S1/S2, no murmur Resp: CTABL, no wheezes, non-labored Ext: No edema, warm Neuro: Alert and oriented, strength 5/5 bilateral lower extremities, negative straight leg raise modified bilaterally MSK Mild tenderness to palpation of the left lateral neck, no midline tenderness over the cervical spine No tenderness over the bony landmarks of the left shoulder No tenderness to palpation over the midline lumbar spine or paraspinal muscles bilaterally,  Assessment and plan:  #Neck pain, new problem to me Likely OA No red flags, no radiculopathy Given IM Kenalog today Also discussed Kenalog Plain film  #Low back pain, lumbar back pain, new problem to me Likely OA as well, no red flags Discussed Tylenol IM Kenalog Plain film    Orders Placed This Encounter  Procedures  . DG Lumbar Spine 2-3 Views    Standing Status:   Future    Number of Occurrences:   1    Standing Expiration Date:   08/15/2018    Order Specific Question:   Reason for Exam (SYMPTOM  OR  DIAGNOSIS REQUIRED)    Answer:   L sided neck pain, and R sided low back pain    Order Specific Question:   Preferred imaging location?    Answer:   Internal  . DG Cervical Spine 2 or 3 views    Standing Status:   Future    Number of Occurrences:   1    Standing Expiration Date:   06/15/2018    Order Specific Question:   Reason for Exam (SYMPTOM  OR DIAGNOSIS REQUIRED)    Answer:   L sided neck pain, and R sided low back pain    Order Specific Question:   Preferred imaging location?    Answer:   Internal    Meds ordered this encounter  Medications  . triamcinolone acetonide (KENALOG-40) injection 40 mg    Laroy Apple, MD [facility] 06/15/2017, 4:22 PM

## 2017-08-26 DIAGNOSIS — M5431 Sciatica, right side: Secondary | ICD-10-CM | POA: Diagnosis not present

## 2017-08-28 ENCOUNTER — Ambulatory Visit (INDEPENDENT_AMBULATORY_CARE_PROVIDER_SITE_OTHER): Payer: Medicare Other | Admitting: Family Medicine

## 2017-08-28 ENCOUNTER — Ambulatory Visit (INDEPENDENT_AMBULATORY_CARE_PROVIDER_SITE_OTHER): Payer: Medicare Other

## 2017-08-28 ENCOUNTER — Encounter: Payer: Self-pay | Admitting: Family Medicine

## 2017-08-28 VITALS — BP 103/70 | HR 107 | Temp 95.6°F | Ht 69.0 in | Wt 155.0 lb

## 2017-08-28 DIAGNOSIS — R634 Abnormal weight loss: Secondary | ICD-10-CM | POA: Diagnosis not present

## 2017-08-28 DIAGNOSIS — J984 Other disorders of lung: Secondary | ICD-10-CM | POA: Diagnosis not present

## 2017-08-28 DIAGNOSIS — R238 Other skin changes: Secondary | ICD-10-CM | POA: Diagnosis not present

## 2017-08-28 DIAGNOSIS — M5431 Sciatica, right side: Secondary | ICD-10-CM

## 2017-08-28 MED ORDER — TRAMADOL HCL 50 MG PO TABS: 50.0000 mg | ORAL_TABLET | Freq: Three times a day (TID) | ORAL | 0 refills | Status: DC | PRN

## 2017-08-28 MED ORDER — CEPHALEXIN 500 MG PO CAPS: 500.0000 mg | ORAL_CAPSULE | Freq: Three times a day (TID) | ORAL | 0 refills | Status: DC

## 2017-08-28 NOTE — Progress Notes (Signed)
HPI  Patient presents today here with weight loss  Pt brings in handwritten list from his son stating that he has 40 lb weight loss in 2 months  Son requesting: C scope, MRI, CT Scan, CXR, Pain Meds ( tylenol not working)  Pt states that he is having R sided low back pain with radiation down to the foot at times, also non radiating L neck pain.  He has had both for 2+ months.   Pt's son believes that pain is keepoing him from eating. Pt reports reduced but regular moderate food intake. He states that hydrocodone given by UC recently has helped but causes slight dizziness.   He is a former smoker  He has a lesion in his gluteal cleft present for approx 2 weeks. No bleeding but is draining at times.   PMH: Smoking status noted ROS: Per HPI  Objective: BP 103/70   Pulse (!) 107   Temp (!) 95.6 F (35.3 C) (Oral)   Ht _0  (1.753 m)   Wt 155 lb (70.3 kg)   BMI 22.89 kg/m  Gen: NAD, alert, cooperative with exam HEENT: NCAT CV: RRR, good S1/S2, no murmur Resp: CTABL, no wheezes, non-labored Abd: SNTND, BS present, no guarding or organomegaly Ext: No edema, warm Neuro: Alert and oriented, No gross deficits Skin:  Erythema and erosion without bleeding or focal lesion of the entire gluteal cleft  Vitals - 1 value per visit 08/28/2017 06/15/2017 05/09/2017 05/09/2017  Weight (lb) 155 172.2 179 179   Vitals - 1 value per visit 01/15/2017 03/13/2016 03/08/2016 01/07/2016  Weight (lb) 178.2 184 184 188    Assessment and plan:  # Weight loss I am very concerned about his weight loss, we have documented 17 lbs in 2 months.  Labs, UA, PSA, FOBT and GI referral if positive.  CXR with smoking Hx   # Skin irritation No clear infection but he has a beefy raw area Keflex Desitin barrier cream  # R sided sciatica No red flags Has hydrocodone form UC which is helping but causing mild dizziness, trial tramadol Refer to Ortho    Orders Placed This Encounter  Procedures  . DG  Chest 2 View    Standing Status:   Future    Number of Occurrences:   1    Standing Expiration Date:   10/29/2018    Order Specific Question:   Reason for Exam (SYMPTOM  OR DIAGNOSIS REQUIRED)    Answer:   weight loss    Order Specific Question:   Preferred imaging location?    Answer:   Internal    Order Specific Question:   Radiology Contrast Protocol - do NOT remove file path    Answer:   [email or web address]  . PSA  . CBC with Differential/Platelet  . CMP14+EGFR  . Urinalysis, Complete  . Ambulatory referral to Orthopedic Surgery    Referral Priority:   Routine    Referral Type:   Surgical    Referral Reason:   Specialty Services Required    Requested Specialty:   Orthopedic Surgery    Number of Visits Requested:   1    Meds ordered this encounter  Medications  . traMADol (ULTRAM) 50 MG tablet    Sig: Take 1 tablet (50 mg total) by mouth every 8 (eight) hours as needed.    Dispense:  30 tablet    Refill:  0  . cephALEXin (KEFLEX) 500 MG capsule    Sig: Take 1 capsule (500 mg  total) by mouth 3 (three) times daily.    Dispense:  21 capsule    Refill:  Hester, MD [facility] 08/28/2017, 5:21 PM

## 2017-08-29 ENCOUNTER — Telehealth: Payer: Self-pay | Admitting: Family Medicine

## 2017-08-29 ENCOUNTER — Other Ambulatory Visit: Payer: Medicare Other

## 2017-08-29 DIAGNOSIS — R911 Solitary pulmonary nodule: Secondary | ICD-10-CM

## 2017-08-29 LAB — CBC WITH DIFFERENTIAL/PLATELET
Basophils Absolute: 0.1 10*3/uL (ref 0.0–0.2)
Basos: 1 %
EOS (ABSOLUTE): 0.4 10*3/uL (ref 0.0–0.4)
EOS: 3 %
HEMATOCRIT: 47.1 % (ref 37.5–51.0)
HEMOGLOBIN: 16.7 g/dL (ref 13.0–17.7)
Immature Grans (Abs): 0.1 10*3/uL (ref 0.0–0.1)
Immature Granulocytes: 1 %
Lymphocytes Absolute: 1.8 10*3/uL (ref 0.7–3.1)
Lymphs: 16 %
MCH: 30.8 pg (ref 26.6–33.0)
MCHC: 35.5 g/dL (ref 31.5–35.7)
MCV: 87 fL (ref 79–97)
MONOS ABS: 0.8 10*3/uL (ref 0.1–0.9)
Monocytes: 8 %
NEUTROS ABS: 7.9 10*3/uL — AB (ref 1.4–7.0)
Neutrophils: 71 %
Platelets: 409 10*3/uL — ABNORMAL HIGH (ref 150–379)
RBC: 5.42 x10E6/uL (ref 4.14–5.80)
RDW: 14.9 % (ref 12.3–15.4)
WBC: 11.1 10*3/uL — AB (ref 3.4–10.8)

## 2017-08-29 LAB — CMP14+EGFR
ALT: 19 IU/L (ref 0–44)
AST: 24 IU/L (ref 0–40)
Albumin/Globulin Ratio: 1.5 (ref 1.2–2.2)
Albumin: 4.4 g/dL (ref 3.5–4.7)
Alkaline Phosphatase: 194 IU/L — ABNORMAL HIGH (ref 39–117)
BUN/Creatinine Ratio: 19 (ref 10–24)
BUN: 23 mg/dL (ref 8–27)
Bilirubin Total: 0.9 mg/dL (ref 0.0–1.2)
CALCIUM: 10.1 mg/dL (ref 8.6–10.2)
CO2: 23 mmol/L (ref 20–29)
Chloride: 97 mmol/L (ref 96–106)
Creatinine, Ser: 1.23 mg/dL (ref 0.76–1.27)
GFR, EST AFRICAN AMERICAN: 61 mL/min/{1.73_m2} (ref >59)
GFR, EST NON AFRICAN AMERICAN: 52 mL/min/{1.73_m2} — AB (ref >59)
GLUCOSE: 104 mg/dL — AB (ref 65–99)
Globulin, Total: 2.9 g/dL (ref 1.5–4.5)
Potassium: 4.4 mmol/L (ref 3.5–5.2)
Sodium: 140 mmol/L (ref 134–144)
TOTAL PROTEIN: 7.3 g/dL (ref 6.0–8.5)

## 2017-08-29 LAB — PSA: PROSTATE SPECIFIC AG, SERUM: 0.7 ng/mL (ref 0.0–4.0)

## 2017-08-29 NOTE — Telephone Encounter (Signed)
Attempted to call patient and he says he cannot hear on the phone so he might have a hearing problem and attempted to call son and left a message, please have them put forth to me if he calls back. Caryl Pina, MD [facility] 08/29/2017, 1:07 PM

## 2017-08-29 NOTE — Telephone Encounter (Signed)
Phone staff aware to forward call.

## 2017-08-29 NOTE — Telephone Encounter (Signed)
-----   Message from [place] sent at 08/29/2017  9:14 AM EDT ----- Please review CXR

## 2017-08-30 ENCOUNTER — Telehealth: Payer: Self-pay | Admitting: Family Medicine

## 2017-08-30 LAB — MICROSCOPIC EXAMINATION
Casts: NONE SEEN /lpf
EPITHELIAL CELLS (NON RENAL): NONE SEEN /HPF (ref 0–10)

## 2017-08-30 LAB — URINALYSIS, COMPLETE
Bilirubin, UA: NEGATIVE
GLUCOSE, UA: NEGATIVE
LEUKOCYTES UA: NEGATIVE
Nitrite, UA: POSITIVE — AB
Protein, UA: NEGATIVE
RBC, UA: NEGATIVE
SPEC GRAV UA: 1.028 (ref 1.005–1.030)
Urobilinogen, Ur: 2 mg/dL — ABNORMAL HIGH (ref 0.2–1.0)
pH, UA: 5 (ref 5.0–7.5)

## 2017-08-30 NOTE — Telephone Encounter (Signed)
No answer on patient's phone, I called his son per release of information and left a voicemail to please call back as we have concerning findings.  Laroy Apple, MD [facility] 08/30/2017, 12:17 PM

## 2017-08-31 ENCOUNTER — Telehealth: Payer: Self-pay | Admitting: Family Medicine

## 2017-08-31 ENCOUNTER — Other Ambulatory Visit: Payer: Medicare Other

## 2017-08-31 NOTE — Telephone Encounter (Signed)
No answer, will attempt again. Called his son, Aaron Salazar has been hard of hearing when my partner called.    Laroy Apple, MD [facility] 08/31/2017, 6:51 PM

## 2017-09-01 ENCOUNTER — Telehealth: Payer: Self-pay | Admitting: Family Medicine

## 2017-09-01 NOTE — Telephone Encounter (Signed)
Attempted call again

## 2017-09-03 ENCOUNTER — Encounter: Payer: Self-pay | Admitting: Family Medicine

## 2017-09-03 ENCOUNTER — Telehealth: Payer: Self-pay | Admitting: Family Medicine

## 2017-09-03 NOTE — Telephone Encounter (Signed)
Called pt again, no answer, letter written.   Laroy Apple, MD [facility] 09/03/2017, 5:01 PM

## 2017-09-10 ENCOUNTER — Telehealth: Payer: Self-pay | Admitting: Family Medicine

## 2017-09-10 DIAGNOSIS — M542 Cervicalgia: Secondary | ICD-10-CM | POA: Diagnosis not present

## 2017-09-10 DIAGNOSIS — M5116 Intervertebral disc disorders with radiculopathy, lumbar region: Secondary | ICD-10-CM | POA: Diagnosis not present

## 2017-09-10 NOTE — Telephone Encounter (Signed)
Son aware of chest CT apt 4/15 at 4pm.

## 2017-09-11 ENCOUNTER — Other Ambulatory Visit: Payer: Self-pay

## 2017-09-11 ENCOUNTER — Emergency Department (HOSPITAL_COMMUNITY): Payer: Medicare Other

## 2017-09-11 ENCOUNTER — Encounter (HOSPITAL_COMMUNITY): Payer: Self-pay

## 2017-09-11 ENCOUNTER — Ambulatory Visit: Payer: Medicare Other | Admitting: Family Medicine

## 2017-09-11 ENCOUNTER — Emergency Department (HOSPITAL_COMMUNITY)
Admission: EM | Admit: 2017-09-11 | Discharge: 2017-09-11 | Disposition: A | Discharge status: Home / Self Care | Payer: Medicare Other | Attending: Emergency Medicine | Admitting: Emergency Medicine

## 2017-09-11 DIAGNOSIS — Z87891 Personal history of nicotine dependence: Secondary | ICD-10-CM | POA: Insufficient documentation

## 2017-09-11 DIAGNOSIS — R634 Abnormal weight loss: Secondary | ICD-10-CM | POA: Insufficient documentation

## 2017-09-11 DIAGNOSIS — R918 Other nonspecific abnormal finding of lung field: Secondary | ICD-10-CM | POA: Insufficient documentation

## 2017-09-11 DIAGNOSIS — I10 Essential (primary) hypertension: Secondary | ICD-10-CM | POA: Diagnosis not present

## 2017-09-11 DIAGNOSIS — Z79899 Other long term (current) drug therapy: Secondary | ICD-10-CM | POA: Diagnosis not present

## 2017-09-11 DIAGNOSIS — R531 Weakness: Secondary | ICD-10-CM | POA: Diagnosis not present

## 2017-09-11 DIAGNOSIS — I714 Abdominal aortic aneurysm, without rupture: Secondary | ICD-10-CM | POA: Diagnosis not present

## 2017-09-11 LAB — CBC WITH DIFFERENTIAL/PLATELET
BASOS ABS: 0.1 10*3/uL (ref 0.0–0.1)
BASOS PCT: 0 %
Eosinophils Absolute: 0.2 10*3/uL (ref 0.0–0.7)
Eosinophils Relative: 2 %
HEMATOCRIT: 46.4 % (ref 39.0–52.0)
Hemoglobin: 15.7 g/dL (ref 13.0–17.0)
LYMPHS PCT: 11 %
Lymphs Abs: 1.3 10*3/uL (ref 0.7–4.0)
MCH: 30.3 pg (ref 26.0–34.0)
MCHC: 33.8 g/dL (ref 30.0–36.0)
MCV: 89.4 fL (ref 78.0–100.0)
Monocytes Absolute: 0.8 10*3/uL (ref 0.1–1.0)
Monocytes Relative: 7 %
NEUTROS ABS: 9.1 10*3/uL — AB (ref 1.7–7.7)
NEUTROS PCT: 80 %
Platelets: 374 10*3/uL (ref 150–400)
RBC: 5.19 MIL/uL (ref 4.22–5.81)
RDW: 13.6 % (ref 11.5–15.5)
WBC: 11.5 10*3/uL — AB (ref 4.0–10.5)

## 2017-09-11 LAB — COMPREHENSIVE METABOLIC PANEL
ALBUMIN: 3.7 g/dL (ref 3.5–5.0)
ALT: 37 U/L (ref 17–63)
AST: 35 U/L (ref 15–41)
Alkaline Phosphatase: 166 U/L — ABNORMAL HIGH (ref 38–126)
Anion gap: 16 — ABNORMAL HIGH (ref 5–15)
BUN: 29 mg/dL — AB (ref 6–20)
CO2: 26 mmol/L (ref 22–32)
CREATININE: 1.2 mg/dL (ref 0.61–1.24)
Calcium: 10 mg/dL (ref 8.9–10.3)
Chloride: 94 mmol/L — ABNORMAL LOW (ref 101–111)
GFR calc Af Amer: >60 mL/min (ref >60)
GFR, EST NON AFRICAN AMERICAN: 53 mL/min — AB (ref >60)
GLUCOSE: 115 mg/dL — AB (ref 65–99)
POTASSIUM: 3.8 mmol/L (ref 3.5–5.1)
Sodium: 136 mmol/L (ref 135–145)
Total Bilirubin: 1.4 mg/dL — ABNORMAL HIGH (ref 0.3–1.2)
Total Protein: 7.8 g/dL (ref 6.5–8.1)

## 2017-09-11 LAB — URINALYSIS, ROUTINE W REFLEX MICROSCOPIC
BILIRUBIN URINE: NEGATIVE
GLUCOSE, UA: NEGATIVE mg/dL
HGB URINE DIPSTICK: NEGATIVE
Ketones, ur: 5 mg/dL — AB
Leukocytes, UA: NEGATIVE
Nitrite: NEGATIVE
Protein, ur: NEGATIVE mg/dL
SPECIFIC GRAVITY, URINE: 1.021 (ref 1.005–1.030)
pH: 5 (ref 5.0–8.0)

## 2017-09-11 LAB — TROPONIN I: Troponin I: <0.03 ng/mL (ref <0.03)

## 2017-09-11 MED ORDER — SODIUM CHLORIDE 0.9 % IV BOLUS (SEPSIS)
1000.0000 mL | Freq: Once | INTRAVENOUS | Status: AC
  Administered 2017-09-11: 1000 mL via INTRAVENOUS

## 2017-09-11 MED ORDER — HYDROCODONE-ACETAMINOPHEN 5-325 MG PO TABS: 1.0000 | ORAL_TABLET | ORAL | 0 refills | Status: DC | PRN

## 2017-09-11 MED ORDER — IOPAMIDOL (ISOVUE-300) INJECTION 61%
100.0000 mL | Freq: Once | INTRAVENOUS | Status: AC | PRN
  Administered 2017-09-11: 100 mL via INTRAVENOUS

## 2017-09-11 MED ORDER — ONDANSETRON 4 MG PO TBDP: ORAL_TABLET | ORAL | 0 refills | Status: DC

## 2017-09-11 NOTE — ED Provider Notes (Signed)
[facility] EMERGENCY DEPARTMENT Provider Note   CSN: [id number] Arrival date & time: 09/11/17  3785     History   Chief Complaint Chief Complaint  Patient presents with  . Nausea  . Weakness    HPI Aaron Salazar is a 84 y.o. male.  Patient complains of weakness for a number of weeks.  He has lost 40 pounds over the last couple months.  He saw his family doctor who found a mass in his lung and was arranging for CT scan.  The history is provided by the patient. No language interpreter was used.  Weakness  Primary symptoms include no focal weakness. This is a chronic problem. The current episode started more than 1 week ago. The problem has not changed since onset.There was no focality noted. Pertinent negatives include no shortness of breath, no chest pain and no headaches. There were no medications administered prior to arrival. Associated medical issues do not include trauma.    Past Medical History:  Diagnosis Date  . GERD (gastroesophageal reflux disease)   . History of shingles 09-2012   shingles above eye  . Hyperlipidemia   . Hypertension   . Hypothyroidism   . Thyroid disease     Patient Active Problem List   Diagnosis Date Noted  . Hypothyroidism 10/06/2010  . HTN (hypertension) 10/06/2010  . Hyperlipidemia 10/06/2010  . Vitamin D deficiency 10/06/2010    Past Surgical History:  Procedure Laterality Date  . CATARACT EXTRACTION W/PHACO Right 03/13/2016   Procedure: CATARACT EXTRACTION PHACO AND INTRAOCULAR LENS PLACEMENT (IOC);  Surgeon: Tonny Branch, MD;  Location: [facility];  Service: Ophthalmology;  Laterality: Right;  CDE: 19.82  . CHOLECYSTECTOMY    . duprens    . TONSILLECTOMY AND ADENOIDECTOMY Bilateral 1940        Home Medications    Prior to Admission medications   Medication Sig Start Date End Date Taking? Authorizing Provider  cephALEXin (KEFLEX) 500 MG capsule Take 1 capsule (500 mg total) by mouth 3 (three) times daily. 08/28/17  Yes  Timmothy Euler, MD  levothyroxine (SYNTHROID, LEVOTHROID) 50 MCG tablet TAKE 1 TABLET (50 MCG TOTAL) BY MOUTH DAILY BEFORE BREAKFAST. 01/15/17  Yes Timmothy Euler, MD  traMADol (ULTRAM) 50 MG tablet Take 1 tablet (50 mg total) by mouth every 8 (eight) hours as needed. 08/28/17  Yes Timmothy Euler, MD  HYDROcodone-acetaminophen (NORCO/VICODIN) 5-325 MG tablet Take 1 tablet by mouth every 4 (four) hours as needed for moderate pain. 09/11/17   Milton Ferguson, MD  ondansetron (ZOFRAN ODT) 4 MG disintegrating tablet 4mg  ODT q4 hours prn nausea/vomit 09/11/17   Milton Ferguson, MD    Family History Family History  Problem Relation Age of Onset  . Mental retardation Son     Social History Social History   Tobacco Use  . Smoking status: Former Smoker    Last attempt to quit: 08/31/1992    Years since quitting: 25.0  . Smokeless tobacco: Never Used  Substance Use Topics  . Alcohol use: No  . Drug use: No     Allergies   Patient has no known allergies.   Review of Systems Review of Systems  Constitutional: Negative for appetite change and fatigue.  HENT: Negative for congestion, ear discharge and sinus pressure.   Eyes: Negative for discharge.  Respiratory: Negative for cough and shortness of breath.   Cardiovascular: Negative for chest pain.  Gastrointestinal: Negative for abdominal pain and diarrhea.  Genitourinary: Negative for frequency and hematuria.  Musculoskeletal: Negative for back pain.  Skin: Negative for rash.  Neurological: Positive for weakness. Negative for focal weakness, seizures and headaches.  Psychiatric/Behavioral: Negative for hallucinations.     Physical Exam Updated Vital Signs BP (!) 145/69   Pulse 87   Temp 98 F (36.7 C) (Oral)   Resp 17   Ht 5\' 9"  (1.753 m)   Wt 70.8 kg (156 lb)   SpO2 98%   BMI 23.04 kg/m   Physical Exam  Constitutional: He is oriented to person, place, and time. He appears well-developed.  HENT:  Head:  Normocephalic.  Dry mucous membrane  Eyes: Conjunctivae and EOM are normal. No scleral icterus.  Neck: Neck supple. No thyromegaly present.  Cardiovascular: Normal rate and regular rhythm. Exam reveals no gallop and no friction rub.  No murmur heard. Pulmonary/Chest: No stridor. He has no wheezes. He has no rales. He exhibits no tenderness.  Abdominal: He exhibits no distension. There is no tenderness. There is no rebound.  Musculoskeletal: Normal range of motion. He exhibits no edema.  Lymphadenopathy:    He has no cervical adenopathy.  Neurological: He is oriented to person, place, and time. He exhibits normal muscle tone. Coordination normal.  Skin: No rash noted. No erythema.  Psychiatric: He has a normal mood and affect. His behavior is normal.     ED Treatments / Results  Labs (all labs ordered are listed, but only abnormal results are displayed) Labs Reviewed  CBC WITH DIFFERENTIAL/PLATELET - Abnormal; Notable for the following components:      Result Value   WBC 11.5 (*)    Neutro Abs 9.1 (*)    All other components within normal limits  COMPREHENSIVE METABOLIC PANEL - Abnormal; Notable for the following components:   Chloride 94 (*)    Glucose, Bld 115 (*)    BUN 29 (*)    Alkaline Phosphatase 166 (*)    Total Bilirubin 1.4 (*)    GFR calc non Af Amer 53 (*)    Anion gap 16 (*)    All other components within normal limits  URINALYSIS, ROUTINE W REFLEX MICROSCOPIC - Abnormal; Notable for the following components:   Ketones, ur 5 (*)    All other components within normal limits  TROPONIN I    EKG None  Radiology Ct Chest W Contrast  Result Date: 09/11/2017 CLINICAL DATA:  Chest pain and shortness of breath. Lung nodule on recent radiographs. Recent weight loss. Abdominal distention. EXAM: CT CHEST, ABDOMEN, AND PELVIS WITH CONTRAST TECHNIQUE: Multidetector CT imaging of the chest, abdomen and pelvis was performed following the standard protocol during bolus  administration of intravenous contrast. CONTRAST:  174mL ISOVUE-300 IOPAMIDOL (ISOVUE-300) INJECTION 61% COMPARISON:  Chest radiographs 08/28/2017. CT abdomen and pelvis 04/19/2006. FINDINGS: CT CHEST FINDINGS Cardiovascular: Thoracic aortic atherosclerosis without aneurysm or dissection. Extensive coronary artery atherosclerosis. Normal heart size. No pericardial effusion. Mediastinum/Nodes: No enlarged axillary lymph nodes. Borderline AP window lymph node measures 10 mm in short axis. Left infrahilar mass as described below. Lungs/Pleura: A heterogeneous mass with internal calcifications in the central aspect of the left lower lobe measures 5.0 x 5.2 x 8.1 cm and extends to the inferior aspect of the left hilum, encasing and narrowing the left lower lobe bronchus and left lower lobe pulmonary artery. The mass mildly invades the adjacent mediastinum. There is a small left pleural effusion with multiple small foci of pleural soft tissue nodularity (for example a 1.5 cm nodule on series 2, image 38). There is  left lower lobe atelectasis. Moderate centrilobular emphysema is noted, and there is left greater than right apical pleuroparenchymal scarring. Scattered small calcified and noncalcified lung nodules are present bilaterally. The largest noncalcified nodules measure 5 mm in the left upper lobe (series 4, image 31) and 4 mm in the right middle lobe (series 4, image 92). Musculoskeletal: No suspicious lytic or blastic osseous lesions. Old, healed posterior right eleventh rib fracture. CT ABDOMEN PELVIS FINDINGS Hepatobiliary: 2 cm region of mild hyperenhancement extending to the periphery of the liver in segment II, isoenhancing on delayed images. Status post cholecystectomy. No biliary dilatation. Pancreas: Unremarkable. Spleen: Multiple small splenic calcifications. Adrenals/Urinary Tract: Unremarkable adrenal glands. 2.1 cm left upper pole renal cyst, enlarged from 2007. No hydronephrosis. Unremarkable bladder.  Stomach/Bowel: Small sliding hiatal hernia. Duodenal diverticulum without acute inflammation. No bowel obstruction. Left-sided colonic diverticulosis without evidence of diverticulitis. Vascular/Lymphatic: New mild aneurysmal dilatation of the infrarenal abdominal aorta, 3.2 x 3.0 cm maximal diameter. No enlarged lymph nodes. Reproductive: Mildly enlarged prostate. Other: No intraperitoneal free fluid. Musculoskeletal: 5.6 cm destructive mass involving the right pubis. Smaller lesion involving the ramus of the right ischium with cortical disruption as well. Destructive lesion involving the majority of the L4 vertebral body with pathologic superior endplate fracture resulting in slight vertebral body height loss. Extra osseous tumor extension laterally to the right of the L4 vertebral body. Degenerative appearing spinal stenosis at L4-5 due to disc bulging and posterior element hypertrophy without gross epidural tumor identified, though MRI would be much more sensitive. IMPRESSION: 1. 8 cm central left lower lobe mass consistent with primary bronchogenic carcinoma. 2. Small left pleural effusion with pleural nodularity compatible with malignant effusion. 3. Multiple osseous metastases including destructive lesions in the L4 vertebral body and right pubis. 4. 2 cm region of mild hyperenhancement in the liver, possibly a benign perfusion abnormality though metastasis is also possible. Consider nonemergent abdominal MRI for further evaluation. 5. Small bilateral lung nodules measuring up to 5 mm. 6. 3.2 cm infrarenal abdominal aortic aneurysm. 7. Aortic Atherosclerosis (ICD10-I70.0) and Emphysema (ICD10-J43.9). Electronically Signed   By: Logan Bores M.D.   On: 09/11/2017 14:58   Ct Abdomen Pelvis W Contrast  Result Date: 09/11/2017 CLINICAL DATA:  Chest pain and shortness of breath. Lung nodule on recent radiographs. Recent weight loss. Abdominal distention. EXAM: CT CHEST, ABDOMEN, AND PELVIS WITH CONTRAST  TECHNIQUE: Multidetector CT imaging of the chest, abdomen and pelvis was performed following the standard protocol during bolus administration of intravenous contrast. CONTRAST:  147mL ISOVUE-300 IOPAMIDOL (ISOVUE-300) INJECTION 61% COMPARISON:  Chest radiographs 08/28/2017. CT abdomen and pelvis 04/19/2006. FINDINGS: CT CHEST FINDINGS Cardiovascular: Thoracic aortic atherosclerosis without aneurysm or dissection. Extensive coronary artery atherosclerosis. Normal heart size. No pericardial effusion. Mediastinum/Nodes: No enlarged axillary lymph nodes. Borderline AP window lymph node measures 10 mm in short axis. Left infrahilar mass as described below. Lungs/Pleura: A heterogeneous mass with internal calcifications in the central aspect of the left lower lobe measures 5.0 x 5.2 x 8.1 cm and extends to the inferior aspect of the left hilum, encasing and narrowing the left lower lobe bronchus and left lower lobe pulmonary artery. The mass mildly invades the adjacent mediastinum. There is a small left pleural effusion with multiple small foci of pleural soft tissue nodularity (for example a 1.5 cm nodule on series 2, image 38). There is left lower lobe atelectasis. Moderate centrilobular emphysema is noted, and there is left greater than right apical pleuroparenchymal scarring. Scattered small calcified and noncalcified  lung nodules are present bilaterally. The largest noncalcified nodules measure 5 mm in the left upper lobe (series 4, image 31) and 4 mm in the right middle lobe (series 4, image 92). Musculoskeletal: No suspicious lytic or blastic osseous lesions. Old, healed posterior right eleventh rib fracture. CT ABDOMEN PELVIS FINDINGS Hepatobiliary: 2 cm region of mild hyperenhancement extending to the periphery of the liver in segment II, isoenhancing on delayed images. Status post cholecystectomy. No biliary dilatation. Pancreas: Unremarkable. Spleen: Multiple small splenic calcifications. Adrenals/Urinary  Tract: Unremarkable adrenal glands. 2.1 cm left upper pole renal cyst, enlarged from 2007. No hydronephrosis. Unremarkable bladder. Stomach/Bowel: Small sliding hiatal hernia. Duodenal diverticulum without acute inflammation. No bowel obstruction. Left-sided colonic diverticulosis without evidence of diverticulitis. Vascular/Lymphatic: New mild aneurysmal dilatation of the infrarenal abdominal aorta, 3.2 x 3.0 cm maximal diameter. No enlarged lymph nodes. Reproductive: Mildly enlarged prostate. Other: No intraperitoneal free fluid. Musculoskeletal: 5.6 cm destructive mass involving the right pubis. Smaller lesion involving the ramus of the right ischium with cortical disruption as well. Destructive lesion involving the majority of the L4 vertebral body with pathologic superior endplate fracture resulting in slight vertebral body height loss. Extra osseous tumor extension laterally to the right of the L4 vertebral body. Degenerative appearing spinal stenosis at L4-5 due to disc bulging and posterior element hypertrophy without gross epidural tumor identified, though MRI would be much more sensitive. IMPRESSION: 1. 8 cm central left lower lobe mass consistent with primary bronchogenic carcinoma. 2. Small left pleural effusion with pleural nodularity compatible with malignant effusion. 3. Multiple osseous metastases including destructive lesions in the L4 vertebral body and right pubis. 4. 2 cm region of mild hyperenhancement in the liver, possibly a benign perfusion abnormality though metastasis is also possible. Consider nonemergent abdominal MRI for further evaluation. 5. Small bilateral lung nodules measuring up to 5 mm. 6. 3.2 cm infrarenal abdominal aortic aneurysm. 7. Aortic Atherosclerosis (ICD10-I70.0) and Emphysema (ICD10-J43.9). Electronically Signed   By: Logan Bores M.D.   On: 09/11/2017 14:58    Procedures Procedures (including critical care time)  Medications Ordered in ED Medications  sodium  chloride 0.9 % bolus 1,000 mL (0 mLs Intravenous Stopped 09/11/17 1433)  iopamidol (ISOVUE-300) 61 % injection 100 mL (100 mLs Intravenous Contrast Given 09/11/17 1404)     Initial Impression / Assessment and Plan / ED Course  I have reviewed the triage vital signs and the nursing notes.  Pertinent labs & imaging results that were available during my care of the patient were reviewed by me and considered in my medical decision making (see chart for details).   CT scan shows lung cancer most likely with metastases.  I spoke with the cancer doctor and they will see him April 25.  We have also made arrangements for him to get a CT guided biopsy of the lesion.  He is given Vicodin and Zofran    Final Clinical Impressions(s) / ED Diagnoses   Final diagnoses:  Weakness    ED Discharge Orders        Ordered    HYDROcodone-acetaminophen (NORCO/VICODIN) 5-325 MG tablet  Every 4 hours PRN     09/11/17 1633    ondansetron (ZOFRAN ODT) 4 MG disintegrating tablet     09/11/17 1633       Milton Ferguson, MD 09/11/17 1636

## 2017-09-11 NOTE — ED Notes (Signed)
Patient in CT

## 2017-09-11 NOTE — Discharge Instructions (Signed)
Follow up with Dr. Delton Coombes at April 25yh 11:10 am.   You need to get a biopsy at [facility].  Call them Friday if you do not here from them sooner

## 2017-09-11 NOTE — ED Triage Notes (Signed)
Son reports pt has lost 40lb in the past 26months and saw his pcp a few weeks ago and had labs and chest xray.  Urine showed uti and chest x ray showed a nodule on his lung that is to be evaluted by CT Monday.  Pt prescribed antibiotics for UTI but only took 3 pills.  Pt c/o back pain, neck pain, bilateral hip and leg pain.   Family says pt has had spine films recently as well.

## 2017-09-12 ENCOUNTER — Other Ambulatory Visit (HOSPITAL_COMMUNITY): Payer: Self-pay | Admitting: Hematology

## 2017-09-12 DIAGNOSIS — R918 Other nonspecific abnormal finding of lung field: Secondary | ICD-10-CM

## 2017-09-17 ENCOUNTER — Ambulatory Visit (HOSPITAL_COMMUNITY): Payer: Medicare Other

## 2017-09-17 ENCOUNTER — Other Ambulatory Visit: Payer: Self-pay | Admitting: Physician Assistant

## 2017-09-17 ENCOUNTER — Telehealth: Payer: Self-pay | Admitting: Family Medicine

## 2017-09-17 MED ORDER — HYDROCODONE-ACETAMINOPHEN 5-325 MG PO TABS: 1.0000 | ORAL_TABLET | ORAL | 0 refills | Status: DC | PRN

## 2017-09-17 NOTE — Telephone Encounter (Signed)
I refilled the medication, can let Dr. Wendi Snipes know, incase long term scripts will need to be written.

## 2017-09-17 NOTE — Telephone Encounter (Signed)
Attempted to return call- NVM

## 2017-09-17 NOTE — Telephone Encounter (Signed)
Pt son Liliane Channel is calling stating that just found out the pt has Cancer in lung and spine, Pt is in terrible pain, seeing oncologist on 25th. [facility] gave the pt pain medicaiton, son states pt only has 10-11 tablets left and will run out before then bc the pt is taking them every 4 hours. He states the pt said he hurts all over, and rick states that he can barley get him out of the bed, he didn't get out of the bed at all yesterday. Son is wanitng to know what Dr Wendi Snipes suggest bc he doesn't want his father to run out of pain medication.

## 2017-09-18 ENCOUNTER — Ambulatory Visit: Payer: Medicare Other | Admitting: Family Medicine

## 2017-09-18 NOTE — Telephone Encounter (Signed)
Apprciate and agree with covering provider.   Laroy Apple, MD [facility] 09/18/2017, 7:52 AM

## 2017-09-27 ENCOUNTER — Inpatient Hospital Stay (HOSPITAL_COMMUNITY): Payer: Medicare Other | Attending: Hematology | Admitting: Hematology

## 2017-09-27 ENCOUNTER — Other Ambulatory Visit: Payer: Self-pay | Admitting: Radiology

## 2017-09-27 ENCOUNTER — Encounter (HOSPITAL_COMMUNITY): Payer: Self-pay | Admitting: Hematology

## 2017-09-27 ENCOUNTER — Other Ambulatory Visit: Payer: Self-pay

## 2017-09-27 VITALS — BP 93/54 | HR 102 | Temp 97.4°F | Resp 20 | Ht 69.0 in | Wt 143.1 lb

## 2017-09-27 DIAGNOSIS — R634 Abnormal weight loss: Secondary | ICD-10-CM

## 2017-09-27 DIAGNOSIS — C3490 Malignant neoplasm of unspecified part of unspecified bronchus or lung: Secondary | ICD-10-CM

## 2017-09-27 DIAGNOSIS — I10 Essential (primary) hypertension: Secondary | ICD-10-CM | POA: Insufficient documentation

## 2017-09-27 DIAGNOSIS — E079 Disorder of thyroid, unspecified: Secondary | ICD-10-CM | POA: Diagnosis not present

## 2017-09-27 DIAGNOSIS — C7951 Secondary malignant neoplasm of bone: Secondary | ICD-10-CM | POA: Diagnosis not present

## 2017-09-27 DIAGNOSIS — Z87891 Personal history of nicotine dependence: Secondary | ICD-10-CM | POA: Diagnosis not present

## 2017-09-27 DIAGNOSIS — R52 Pain, unspecified: Secondary | ICD-10-CM | POA: Insufficient documentation

## 2017-09-27 DIAGNOSIS — C787 Secondary malignant neoplasm of liver and intrahepatic bile duct: Secondary | ICD-10-CM | POA: Insufficient documentation

## 2017-09-27 DIAGNOSIS — R110 Nausea: Secondary | ICD-10-CM | POA: Insufficient documentation

## 2017-09-27 DIAGNOSIS — C3492 Malignant neoplasm of unspecified part of left bronchus or lung: Secondary | ICD-10-CM

## 2017-09-27 DIAGNOSIS — Z803 Family history of malignant neoplasm of breast: Secondary | ICD-10-CM | POA: Diagnosis not present

## 2017-09-27 DIAGNOSIS — C3432 Malignant neoplasm of lower lobe, left bronchus or lung: Secondary | ICD-10-CM | POA: Insufficient documentation

## 2017-09-27 DIAGNOSIS — Z7189 Other specified counseling: Secondary | ICD-10-CM

## 2017-09-27 MED ORDER — ONDANSETRON 4 MG PO TBDP: ORAL_TABLET | ORAL | 0 refills | Status: AC

## 2017-09-27 MED ORDER — HYDROCODONE-ACETAMINOPHEN 5-325 MG PO TABS: 1.0000 | ORAL_TABLET | ORAL | 0 refills | Status: DC | PRN

## 2017-09-27 MED ORDER — ONDANSETRON HCL 8 MG PO TABS: 4.0000 mg | ORAL_TABLET | ORAL | 0 refills | Status: AC

## 2017-09-27 NOTE — Progress Notes (Signed)
-  Faxed referral and patient records to [facility] [phone].   -Faxed referral and patient records to [facility] [phone].

## 2017-09-27 NOTE — Patient Instructions (Signed)
[facility] Discharge Instructions  Today you saw Dr. K.   Thank you for choosing  Cancer Center at [facility] to provide your oncology and hematology care.  To afford each patient quality time with our provider, please arrive at least 15 minutes before your scheduled appointment time.   If you have a lab appointment with the Cancer Center please come in thru the  Main Entrance and check in at the main information desk  You need to re-schedule your appointment should you arrive 10 or more minutes late.  We strive to give you quality time with our providers, and arriving late affects you and other patients whose appointments are after yours.  Also, if you no show three or more times for appointments you may be dismissed from the clinic at the providers discretion.     Again, thank you for choosing [facility].  Our hope is that these requests will decrease the amount of time that you wait before being seen by our physicians.       _____________________________________________________________  Should you have questions after your visit to [facility], please contact our office at [phone] between the hours of 8:30 a.m. and 4:30 p.m.  Voicemails left after 4:30 p.m. will not be returned until the following business day.  For prescription refill requests, have your pharmacy contact our office.       Resources For Cancer Patients and their Caregivers ? American Cancer Society: Can assist with transportation, wigs, general needs, runs Look Good Feel Better.        1-888-227-6333 ? Cancer Care: Provides financial assistance, online support groups, medication/co-pay assistance.  1-800-813-HOPE (4673) ? [facility] Assists [place] cancer patients and their families through emotional , educational and financial support.  336-427-4357 ? [place] DSS Where to apply for food  stamps, Medicaid and utility assistance. 336-342-1394 ? [facility]: Transportation to medical appointments. 336-347-2287 ? Social Security Administration: May apply for disability if have a Stage IV cancer. 336-342-7796 1-800-772-1213 ? [place] Aging, Disability and Transit Services: Assists with nutrition, care and transit needs. 336-349-2343  Cancer Center Support Programs:   > Cancer Support Group  2nd Tuesday of the month 1pm-2pm, Journey Room   > Creative Journey  3rd Tuesday of the month 1130am-1pm, Journey Room    

## 2017-09-27 NOTE — Progress Notes (Signed)
[facility] NOTE  Patient Care Team: Timmothy Euler, MD as PCP - General (Family Medicine)  CHIEF COMPLAINTS/PURPOSE OF CONSULTATION:  Suspicious lung mass with possible metastatic disease to the bone, spine, and liver  HISTORY OF PRESENTING ILLNESS:  Aaron Salazar 84 y.o. male is here d/t recent ED visit on 09/11/17 with complaints of generalized weakness for several weeks. Noted to have 40 lbs weight loss over past few months.  CT chest/abd/pelvis on 09/11/17 demonstrated 8 cm central left lower lobe mass, consistent with primary bronchogenic carcinoma.  Also noted to have small left pleural effusion with pleural nodularity compatible with malignant effusion.  Multiple osseous metastases were noted including destructive lesions in the L4 vertebral body and right pubis.  2 cm region of mild hyperintense enhancement in the liver, possibly benign perfusion, though metastasis is also possible.  Small bilateral lung nodules noted measuring up to 5 mm.  He has not had biopsy of any of the lesions of concern.     INTERVAL HISTORY:  Aaron Salazar 84 y.o. male here today for initial consultation for presumed lung cancer.    Here today with his son and daughter-in-law.  He is scheduled for biopsy tomorrow, "but that will take him out. He has deteriorated so much in the past 3 weeks."    Was living alone; now lives with his son.  Initially had severe pain to his neck, legs, and back.  X-rays were done and was told he had arthritis. He went to an urgent care for pain; was given Rx for hydrocodone.  He had an X-ray, which showed "lung cancer."  He was referred to an orthopedic surgeon, who told him that "it was the normal aging issues of an 84 year old man."  This precipitated a visit to ED.    He has progressive pain, particularly to his right hip; both hips hurt, but right is worse than left.  Pain has progressively gotten worse over the past 3 months.  He has lost considerable  amount of weight in the past 3 months as well (at least 40 lbs).  He spends much of his time in bed. When he walks, he has to walk with walker.  Many days he will stay in bed all day because the pain is so severe.  He has decreased appetite; his family is trying to have him drink Ensure as much as he can tolerate.  He has lost 11 more lbs in the past 2 weeks.  He is not eating well at all; it is also difficult to get him to drink liquids as well.    Has not had a BM in ~1 week. He had severe constipation recently and took 1 dose of Miralax and had stools for 3 days.    History of cigarette smoking; smoked ~2 ppd x 40 years. Quit 20+ years ago.    Recent CT images reviewed and discussed with patient/family in detail today.    Goals of care discussion today. Shared with pt/family that this is likely Stage IV cancer, which is not curable.  Given patient's deteriorating clinical condition, as well as his advanced age, we would recommend palliation of symptoms alone. We would not recommend pursuing biopsy for definitive biopsy, as we do not suspect he would be able to tolerate systemic therapy.  Would recommend referral to radiation oncology for consideration of palliative XRT to (R) pubis.  This is the area of pt's considerable pain.  Patient and family are agreeable to  referral to home hospice.  Family has expressed desires to pursue treatments with comfort means alone; they are not interested in pursuing active treatment for the cancer given his weakened state.  [facility] is closer to their home, so they would prefer referral to [facility] for consideration for palliative radiation; we will help facilitate that referral.    Will e-scribe refill of Norco to his pharmacy to help bridge his pain control until hospice can evaluate him at home.  Rx e-scribed to [facility] by Mike Craze, NP using [employer] 2-step verification process.    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     MEDICAL HISTORY:  Past Medical History:  Diagnosis Date  . GERD (gastroesophageal reflux disease)   . History of shingles 09-2012   shingles above eye  . Hyperlipidemia   . Hypertension   . Hypothyroidism   . Thyroid disease     SURGICAL HISTORY: Past Surgical History:  Procedure Laterality Date  . CATARACT EXTRACTION W/PHACO Right 03/13/2016   Procedure: CATARACT EXTRACTION PHACO AND INTRAOCULAR LENS PLACEMENT (IOC);  Surgeon: Tonny Branch, MD;  Location: [facility];  Service: Ophthalmology;  Laterality: Right;  CDE: 19.82  . CHOLECYSTECTOMY    . duprens    . TONSILLECTOMY AND ADENOIDECTOMY Bilateral 1940    SOCIAL HISTORY: Social History   Socioeconomic History  . Marital status: Divorced    Spouse name: Not on file  . Number of children: Not on file  . Years of education: Not on file  . Highest education level: Not on file  Occupational History  . Not on file  Social Needs  . Financial resource strain: Not on file  . Food insecurity:    Worry: Not on file    Inability: Not on file  . Transportation needs:    Medical: Not on file    Non-medical: Not on file  Tobacco Use  . Smoking status: Former Smoker    Last attempt to quit: 08/31/1992    Years since quitting: 25.0  . Smokeless tobacco: Never Used  . Tobacco comment: smoked 50 years  Substance and Sexual Activity  . Alcohol use: No  . Drug use: No  . Sexual activity: Never    Birth control/protection: Abstinence  Lifestyle  . Physical activity:    Days per week: Not on file    Minutes per session: Not on file  . Stress: Not on file  Relationships  . Social connections:    Talks on phone: Not on file    Gets together: Not on file    Attends religious service: Not on file    Active member of club or organization: Not on file    Attends meetings of clubs or organizations: Not on file    Relationship status: Not on file  . Intimate partner violence:    Fear of current or ex partner: Not on file     Emotionally abused: Not on file    Physically abused: Not on file    Forced sexual activity: Not on file  Other Topics Concern  . Not on file  Social History Narrative  . Not on file    FAMILY HISTORY: Family History  Problem Relation Age of Onset  . Mental retardation Son   . Hypertension Son   . Breast cancer Mother   . Heart attack Father   . Breast cancer Sister   . Heart failure Brother     ALLERGIES:  has No Known Allergies.  MEDICATIONS:  Current Outpatient Medications  Medication Sig Dispense Refill  . HYDROcodone-acetaminophen (NORCO/VICODIN) 5-325 MG tablet Take 1-2 tablets by mouth every 4 (four) hours as needed for moderate pain. 60 tablet 0  . levothyroxine (SYNTHROID, LEVOTHROID) 50 MCG tablet TAKE 1 TABLET (50 MCG TOTAL) BY MOUTH DAILY BEFORE BREAKFAST. 90 tablet 3  . ondansetron (ZOFRAN ODT) 4 MG disintegrating tablet 4mg  ODT q4 hours prn nausea/vomit 12 tablet 0   No current facility-administered medications for this visit.     REVIEW OF SYSTEMS:   Constitutional: Denies fevers, chills or abnormal night sweats.  Positive for weight loss.  Positive for decreased appetite. Eyes: Denies blurriness of vision, double vision or watery eyes Ears, nose, mouth, throat, and face: Denies mucositis or sore throat Respiratory: Denies cough, dyspnea or wheezes Cardiovascular: Denies palpitation, chest discomfort or lower extremity swelling Gastrointestinal:  Denies heartburn or change in bowel habits Skin: Denies abnormal skin rashes.  Positive for nausea. Lymphatics: Denies new lymphadenopathy or easy bruising Neurological:Denies numbness, tingling or new weaknesses.  Positive for pain in the right hip. Behavioral/Psych: Mood is stable, no new changes  All other systems were reviewed with the patient and are negative.  PHYSICAL EXAMINATION: ECOG PERFORMANCE STATUS: 3  Vitals:   09/27/17 1121  BP: (!) 93/54  Pulse: (!) 102  Resp: 20  Temp: (!) 97.4 F (36.3 C)    Filed Weights   09/27/17 1121  Weight: 143 lb 1.6 oz (64.9 kg)    GENERAL:alert, no distress and comfortable.  Patient sitting in wheelchair. SKIN: skin color, texture, turgor are normal, no rashes or significant lesions EYES: normal, conjunctiva are pink and non-injected, sclera clear OROPHARYNX:no exudate, no erythema and lips, buccal mucosa, and tongue normal  NECK: supple, thyroid normal size, non-tender, without nodularity LYMPH:  no palpable lymphadenopathy in the cervical, axillary or inguinal LUNGS: clear to auscultation and percussion with normal breathing effort HEART: regular rate & rhythm and no murmurs and no lower extremity edema ABDOMEN: Limited abdominal exam revealed no masses. Musculoskeletal:no cyanosis of digits and no clubbing  PSYCH: alert & oriented x 3 with fluent speech NEURO: no focal motor/sensory deficits  LABORATORY DATA:  I have reviewed the data as listed Lab Results  Component Value Date   WBC 11.5 (H) 09/11/2017   HGB 15.7 09/11/2017   HCT 46.4 09/11/2017   MCV 89.4 09/11/2017   PLT 374 09/11/2017     Chemistry      Component Value Date/Time   NA 136 09/11/2017 1040   NA 140 08/28/2017 1608   K 3.8 09/11/2017 1040   CL 94 (L) 09/11/2017 1040   CO2 26 09/11/2017 1040   BUN 29 (H) 09/11/2017 1040   BUN 23 08/28/2017 1608   CREATININE 1.20 09/11/2017 1040   CREATININE 1.23 11/29/2012 1006      Component Value Date/Time   CALCIUM 10.0 09/11/2017 1040   ALKPHOS 166 (H) 09/11/2017 1040   AST 35 09/11/2017 1040   ALT 37 09/11/2017 1040   BILITOT 1.4 (H) 09/11/2017 1040   BILITOT 0.9 08/28/2017 1608       RADIOGRAPHIC STUDIES: I have personally reviewed the radiological images as listed and agreed with the findings in the report. Dg Chest 2 View  Result Date: 08/29/2017 CLINICAL DATA:  Weight loss. EXAM: CHEST - 2 VIEW COMPARISON:  None. FINDINGS: The heart size and mediastinal contours are within normal limits. Normal pulmonary  vascularity. Atherosclerotic calcification of the aortic arch. Increased 2.4 cm nodular density within  the left lower lobe. No pleural effusion pneumothorax. No acute osseous abnormality. IMPRESSION: 2.4 cm nodular density within the left lower lobe. Recommend chest CT, with contrast if possible, for further evaluation. These results will be called to the ordering clinician or representative by the Radiologist Assistant, and communication documented in the PACS or zVision Dashboard. Electronically Signed   By: Titus Dubin M.D.   On: 08/29/2017 08:49   Ct Chest W Contrast  Result Date: 09/11/2017 CLINICAL DATA:  Chest pain and shortness of breath. Lung nodule on recent radiographs. Recent weight loss. Abdominal distention. EXAM: CT CHEST, ABDOMEN, AND PELVIS WITH CONTRAST TECHNIQUE: Multidetector CT imaging of the chest, abdomen and pelvis was performed following the standard protocol during bolus administration of intravenous contrast. CONTRAST:  13mL ISOVUE-300 IOPAMIDOL (ISOVUE-300) INJECTION 61% COMPARISON:  Chest radiographs 08/28/2017. CT abdomen and pelvis 04/19/2006. FINDINGS: CT CHEST FINDINGS Cardiovascular: Thoracic aortic atherosclerosis without aneurysm or dissection. Extensive coronary artery atherosclerosis. Normal heart size. No pericardial effusion. Mediastinum/Nodes: No enlarged axillary lymph nodes. Borderline AP window lymph node measures 10 mm in short axis. Left infrahilar mass as described below. Lungs/Pleura: A heterogeneous mass with internal calcifications in the central aspect of the left lower lobe measures 5.0 x 5.2 x 8.1 cm and extends to the inferior aspect of the left hilum, encasing and narrowing the left lower lobe bronchus and left lower lobe pulmonary artery. The mass mildly invades the adjacent mediastinum. There is a small left pleural effusion with multiple small foci of pleural soft tissue nodularity (for example a 1.5 cm nodule on series 2, image 38). There is left  lower lobe atelectasis. Moderate centrilobular emphysema is noted, and there is left greater than right apical pleuroparenchymal scarring. Scattered small calcified and noncalcified lung nodules are present bilaterally. The largest noncalcified nodules measure 5 mm in the left upper lobe (series 4, image 31) and 4 mm in the right middle lobe (series 4, image 92). Musculoskeletal: No suspicious lytic or blastic osseous lesions. Old, healed posterior right eleventh rib fracture. CT ABDOMEN PELVIS FINDINGS Hepatobiliary: 2 cm region of mild hyperenhancement extending to the periphery of the liver in segment II, isoenhancing on delayed images. Status post cholecystectomy. No biliary dilatation. Pancreas: Unremarkable. Spleen: Multiple small splenic calcifications. Adrenals/Urinary Tract: Unremarkable adrenal glands. 2.1 cm left upper pole renal cyst, enlarged from 2007. No hydronephrosis. Unremarkable bladder. Stomach/Bowel: Small sliding hiatal hernia. Duodenal diverticulum without acute inflammation. No bowel obstruction. Left-sided colonic diverticulosis without evidence of diverticulitis. Vascular/Lymphatic: New mild aneurysmal dilatation of the infrarenal abdominal aorta, 3.2 x 3.0 cm maximal diameter. No enlarged lymph nodes. Reproductive: Mildly enlarged prostate. Other: No intraperitoneal free fluid. Musculoskeletal: 5.6 cm destructive mass involving the right pubis. Smaller lesion involving the ramus of the right ischium with cortical disruption as well. Destructive lesion involving the majority of the L4 vertebral body with pathologic superior endplate fracture resulting in slight vertebral body height loss. Extra osseous tumor extension laterally to the right of the L4 vertebral body. Degenerative appearing spinal stenosis at L4-5 due to disc bulging and posterior element hypertrophy without gross epidural tumor identified, though MRI would be much more sensitive. IMPRESSION: 1. 8 cm central left lower lobe  mass consistent with primary bronchogenic carcinoma. 2. Small left pleural effusion with pleural nodularity compatible with malignant effusion. 3. Multiple osseous metastases including destructive lesions in the L4 vertebral body and right pubis. 4. 2 cm region of mild hyperenhancement in the liver, possibly a benign perfusion abnormality though metastasis is also possible.  Consider nonemergent abdominal MRI for further evaluation. 5. Small bilateral lung nodules measuring up to 5 mm. 6. 3.2 cm infrarenal abdominal aortic aneurysm. 7. Aortic Atherosclerosis (ICD10-I70.0) and Emphysema (ICD10-J43.9). Electronically Signed   By: Logan Bores M.D.   On: 09/11/2017 14:58   Ct Abdomen Pelvis W Contrast  Result Date: 09/11/2017 CLINICAL DATA:  Chest pain and shortness of breath. Lung nodule on recent radiographs. Recent weight loss. Abdominal distention. EXAM: CT CHEST, ABDOMEN, AND PELVIS WITH CONTRAST TECHNIQUE: Multidetector CT imaging of the chest, abdomen and pelvis was performed following the standard protocol during bolus administration of intravenous contrast. CONTRAST:  166mL ISOVUE-300 IOPAMIDOL (ISOVUE-300) INJECTION 61% COMPARISON:  Chest radiographs 08/28/2017. CT abdomen and pelvis 04/19/2006. FINDINGS: CT CHEST FINDINGS Cardiovascular: Thoracic aortic atherosclerosis without aneurysm or dissection. Extensive coronary artery atherosclerosis. Normal heart size. No pericardial effusion. Mediastinum/Nodes: No enlarged axillary lymph nodes. Borderline AP window lymph node measures 10 mm in short axis. Left infrahilar mass as described below. Lungs/Pleura: A heterogeneous mass with internal calcifications in the central aspect of the left lower lobe measures 5.0 x 5.2 x 8.1 cm and extends to the inferior aspect of the left hilum, encasing and narrowing the left lower lobe bronchus and left lower lobe pulmonary artery. The mass mildly invades the adjacent mediastinum. There is a small left pleural effusion with  multiple small foci of pleural soft tissue nodularity (for example a 1.5 cm nodule on series 2, image 38). There is left lower lobe atelectasis. Moderate centrilobular emphysema is noted, and there is left greater than right apical pleuroparenchymal scarring. Scattered small calcified and noncalcified lung nodules are present bilaterally. The largest noncalcified nodules measure 5 mm in the left upper lobe (series 4, image 31) and 4 mm in the right middle lobe (series 4, image 92). Musculoskeletal: No suspicious lytic or blastic osseous lesions. Old, healed posterior right eleventh rib fracture. CT ABDOMEN PELVIS FINDINGS Hepatobiliary: 2 cm region of mild hyperenhancement extending to the periphery of the liver in segment II, isoenhancing on delayed images. Status post cholecystectomy. No biliary dilatation. Pancreas: Unremarkable. Spleen: Multiple small splenic calcifications. Adrenals/Urinary Tract: Unremarkable adrenal glands. 2.1 cm left upper pole renal cyst, enlarged from 2007. No hydronephrosis. Unremarkable bladder. Stomach/Bowel: Small sliding hiatal hernia. Duodenal diverticulum without acute inflammation. No bowel obstruction. Left-sided colonic diverticulosis without evidence of diverticulitis. Vascular/Lymphatic: New mild aneurysmal dilatation of the infrarenal abdominal aorta, 3.2 x 3.0 cm maximal diameter. No enlarged lymph nodes. Reproductive: Mildly enlarged prostate. Other: No intraperitoneal free fluid. Musculoskeletal: 5.6 cm destructive mass involving the right pubis. Smaller lesion involving the ramus of the right ischium with cortical disruption as well. Destructive lesion involving the majority of the L4 vertebral body with pathologic superior endplate fracture resulting in slight vertebral body height loss. Extra osseous tumor extension laterally to the right of the L4 vertebral body. Degenerative appearing spinal stenosis at L4-5 due to disc bulging and posterior element hypertrophy  without gross epidural tumor identified, though MRI would be much more sensitive. IMPRESSION: 1. 8 cm central left lower lobe mass consistent with primary bronchogenic carcinoma. 2. Small left pleural effusion with pleural nodularity compatible with malignant effusion. 3. Multiple osseous metastases including destructive lesions in the L4 vertebral body and right pubis. 4. 2 cm region of mild hyperenhancement in the liver, possibly a benign perfusion abnormality though metastasis is also possible. Consider nonemergent abdominal MRI for further evaluation. 5. Small bilateral lung nodules measuring up to 5 mm. 6. 3.2 cm infrarenal abdominal aortic aneurysm.  7. Aortic Atherosclerosis (ICD10-I70.0) and Emphysema (ICD10-J43.9). Electronically Signed   By: Logan Bores M.D.   On: 09/11/2017 14:58    ASSESSMENT & PLAN:  Metastatic primary lung cancer, left ([place]) 1.  Presumed metastatic lung cancer to the bones and liver: - Patient recently presented to the ER with weakness and right hip pain, CT scan showed left lung mass, possible liver metastasis and right pubic bone lesion with soft tissue component -Patient lost 40 pounds in the last 3 to 4 months, confined to bed more than 90% of the time secondary to pain, poorly controlled with hydrocodone 5 mg as needed -Patient scheduled for CT-guided lung biopsy tomorrow -I had a prolonged discussion with the patient and his family including his son and daughter-in-law.  Because of his advanced age and poor performance status and advanced cancer, I have recommended palliative care with hospice.  I have also offered a consultation with radiation oncology Dr. Quitman Livings for palliative radiation to the right pubis for pain control.  Patient and his family is agreeable to this option.  We will proceed with a hospice consultation.  We will cancel the CT-guided biopsy tomorrow.  2.  Uncontrolled pain: -We will increase the hydrocodone to 2 tablets every 8 3 to 4 hours as  needed.  Palliative radiation to the right pubic lesion will help with pain control.  3.  Nausea: We will call in Zofran 4 mg ODT every 8 hours as needed.  No orders of the defined types were placed in this encounter.   All questions were answered. The patient knows to call the clinic with any problems, questions or concerns. Total time spent is 60 minutes with more than 50% of the time spent face-to-face discussing diagnosis, prognosis, treatment options, goals of care and coordination of care.  This note includes documentation from Mike Craze, NP, who was present during this patient's office visit and evaluation.  I have reviewed this note for its completeness and accuracy.       Derek Jack, MD 09/27/2017 12:51 PM

## 2017-09-27 NOTE — Assessment & Plan Note (Signed)
1.  Presumed metastatic lung cancer to the bones and liver: - Patient recently presented to the ER with weakness and right hip pain, CT scan showed left lung mass, possible liver metastasis and right pubic bone lesion with soft tissue component -Patient lost 40 pounds in the last 3 to 4 months, confined to bed more than 90% of the time secondary to pain, poorly controlled with hydrocodone 5 mg as needed -Patient scheduled for CT-guided lung biopsy tomorrow -I had a prolonged discussion with the patient and his family including his son and daughter-in-law.  Because of his advanced age and poor performance status and advanced cancer, I have recommended palliative care with hospice.  I have also offered a consultation with radiation oncology Dr. Quitman Livings for palliative radiation to the right pubis for pain control.  Patient and his family is agreeable to this option.  We will proceed with a hospice consultation.  We will cancel the CT-guided biopsy tomorrow.  2.  Uncontrolled pain: -We will increase the hydrocodone to 2 tablets every 8 3 to 4 hours as needed.  Palliative radiation to the right pubic lesion will help with pain control.  3.  Nausea: We will call in Zofran 4 mg ODT every 8 hours as needed.

## 2017-09-27 NOTE — Addendum Note (Signed)
Addended by: Boneta Lucks L on: 09/27/2017 01:39 PM   Modules accepted: Orders

## 2017-09-28 ENCOUNTER — Encounter (HOSPITAL_COMMUNITY): Payer: Self-pay

## 2017-09-28 ENCOUNTER — Ambulatory Visit (HOSPITAL_COMMUNITY)
Admission: RE | Admit: 2017-09-28 | Discharge: 2017-09-28 | Disposition: A | Discharge status: Home / Self Care | Payer: Medicare Other | Source: Ambulatory Visit | Attending: Hematology | Admitting: Hematology

## 2017-10-02 ENCOUNTER — Encounter: Payer: Self-pay | Admitting: General Practice

## 2017-10-02 NOTE — Progress Notes (Signed)
[facility] Psychosocial Distress Screening Clinical Social Work  Clinical Social Work was referred by distress screening protocol.  The patient scored a 9 on the Psychosocial Distress Thermometer which indicates severe distress. Clinical Social Worker Edwyna Shell to assess for distress and other psychosocial needs. Spoke w son, "we're getting by day by day, he's moved in w [place]."  Son reports that father spends much of the day in bed, has difficulty eating, is quite weak and experiencing significant pain.  Hospice has assessed patient, but cannot begin services as patient is scheduled for palliative radiation this week for symptom management.  Family is meeting tonight w patient to discuss options/desires/further treatment.  Son is most concerned that patient's pain be well managed, understands that patient has limited anticipated life span due to advanced disease.  Family is functioning well, providing care as they can for patient - neighbors and other relatives check in periodically.  Family will stay in touch w [place] and request help as needs arise.   ONCBCN DISTRESS SCREENING 09/27/2017  Screening Type Initial Screening  Distress experienced in past week (1-10) 9  Emotional problem type Depression  Information Concerns Type Lack of info about treatment;Lack of info about diagnosis  Physical Problem type Pain;Skin dry/itchy;Loss of appetitie;Bathing/dressing;Getting around;Nausea/vomiting  Physician notified of physical symptoms No;Yes  Referral to clinical psychology No  Referral to clinical social work No  Referral to dietition No  Referral to financial advocate No  Referral to support programs No  Referral to palliative care No;Yes    Clinical Social Worker follow up needed: No.  If yes, follow up plan: Son has CSW contact information and will reach out to [place] or [place] staff as needed for patient and family support.  Edwyna Shell, LCSW Clinical Social Worker Phone:   [phone]

## 2017-10-05 ENCOUNTER — Telehealth: Payer: Self-pay | Admitting: Family Medicine

## 2017-10-05 ENCOUNTER — Other Ambulatory Visit: Payer: Self-pay | Admitting: Family Medicine

## 2017-10-05 DIAGNOSIS — C3490 Malignant neoplasm of unspecified part of unspecified bronchus or lung: Secondary | ICD-10-CM

## 2017-10-05 DIAGNOSIS — C7951 Secondary malignant neoplasm of bone: Secondary | ICD-10-CM | POA: Diagnosis not present

## 2017-10-05 DIAGNOSIS — C3432 Malignant neoplasm of lower lobe, left bronchus or lung: Secondary | ICD-10-CM | POA: Diagnosis not present

## 2017-10-05 MED ORDER — HYDROCODONE-ACETAMINOPHEN 5-325 MG PO TABS: 1.0000 | ORAL_TABLET | ORAL | 0 refills | Status: DC | PRN

## 2017-10-05 NOTE — Telephone Encounter (Signed)
Form on providers desk for signature

## 2017-10-08 ENCOUNTER — Telehealth: Payer: Self-pay | Admitting: *Deleted

## 2017-10-08 MED ORDER — OFLOXACIN 0.3 % OP SOLN: 1.0000 [drp] | Freq: Four times a day (QID) | OPHTHALMIC | 0 refills | Status: AC

## 2017-10-08 NOTE — Telephone Encounter (Signed)
Ocuflox sent in to his pharmacy.   Laroy Apple, MD [facility] 10/08/2017, 5:03 PM

## 2017-10-08 NOTE — Telephone Encounter (Signed)
VM received from Hospice nurse Pt's eyes have drainage and were matted together today Please advise if eye drops can be sent into [place]

## 2017-10-09 NOTE — Telephone Encounter (Signed)
Aaron Salazar at [facility] aware that prescription has been sent in.

## 2017-10-09 NOTE — Telephone Encounter (Signed)
LMOVM that DNR form is at front desk ready to be picked up

## 2017-10-11 ENCOUNTER — Ambulatory Visit (INDEPENDENT_AMBULATORY_CARE_PROVIDER_SITE_OTHER): Payer: Medicare Other

## 2017-10-11 DIAGNOSIS — R52 Pain, unspecified: Secondary | ICD-10-CM

## 2017-10-11 DIAGNOSIS — E039 Hypothyroidism, unspecified: Secondary | ICD-10-CM

## 2017-10-11 DIAGNOSIS — I10 Essential (primary) hypertension: Secondary | ICD-10-CM | POA: Diagnosis not present

## 2017-10-11 DIAGNOSIS — E43 Unspecified severe protein-calorie malnutrition: Secondary | ICD-10-CM | POA: Diagnosis not present

## 2017-10-11 DIAGNOSIS — R630 Anorexia: Secondary | ICD-10-CM

## 2017-10-11 DIAGNOSIS — E785 Hyperlipidemia, unspecified: Secondary | ICD-10-CM

## 2017-10-11 DIAGNOSIS — R1310 Dysphagia, unspecified: Secondary | ICD-10-CM

## 2017-10-11 DIAGNOSIS — R531 Weakness: Secondary | ICD-10-CM

## 2017-10-11 DIAGNOSIS — R0602 Shortness of breath: Secondary | ICD-10-CM

## 2017-10-11 DIAGNOSIS — R110 Nausea: Secondary | ICD-10-CM

## 2017-10-18 ENCOUNTER — Other Ambulatory Visit: Payer: Self-pay | Admitting: Family Medicine

## 2017-10-18 DIAGNOSIS — C3490 Malignant neoplasm of unspecified part of unspecified bronchus or lung: Secondary | ICD-10-CM

## 2017-11-03 DEATH — deceased

## 2019-04-07 IMAGING — CT CT ABD-PELV W/ CM
2 of 5 series · 11 of 36 positions shown, 13 images · IV contrast (Isovue)
Comparison: Chest radiographs 08/28/2017. CT abdomen and pelvis
04/19/2006.

CLINICAL DATA: Chest pain and shortness of breath. Lung nodule on
recent radiographs. Recent weight loss. Abdominal distention.

EXAM:
CT CHEST, ABDOMEN, AND PELVIS WITH CONTRAST
TECHNIQUE: Multidetector CT imaging of the chest, abdomen and pelvis was
performed following the standard protocol during bolus
administration of intravenous contrast.
CONTRAST:  100mL [id number] IOPAMIDOL ([id number]) INJECTION 61%

[Series 2: cap with · axial · 0.81mm/px · z∈[+658,+1178]mm · 8 of 131 slices shown, 10 images]
[im 14/131  mediastinal]
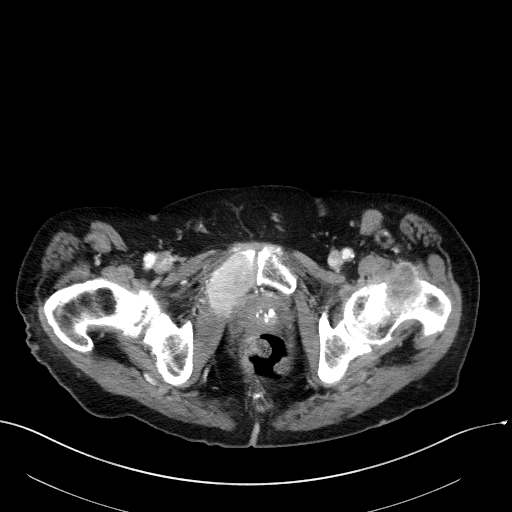
[im 14/131  lung]
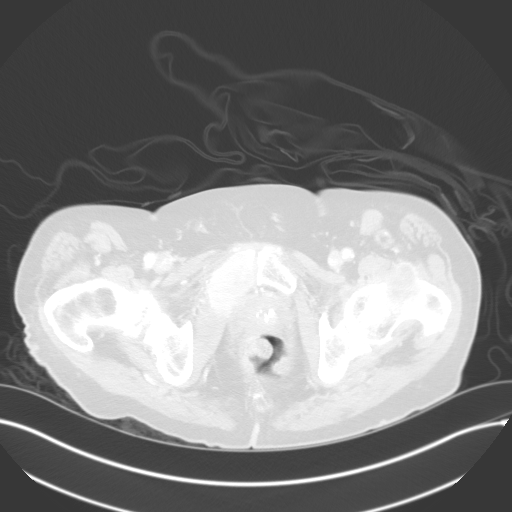
[im 27/131  lung]
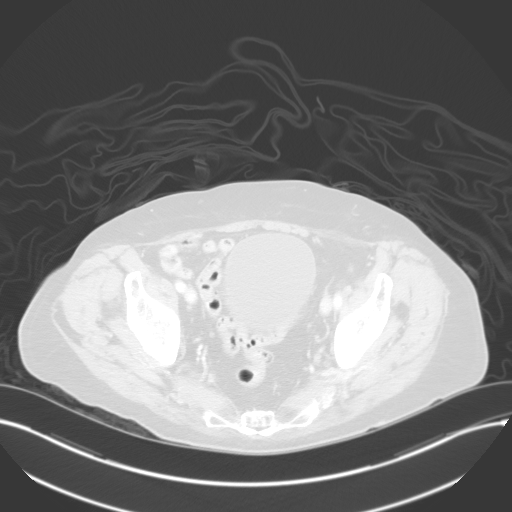
[im 40/131  lung]
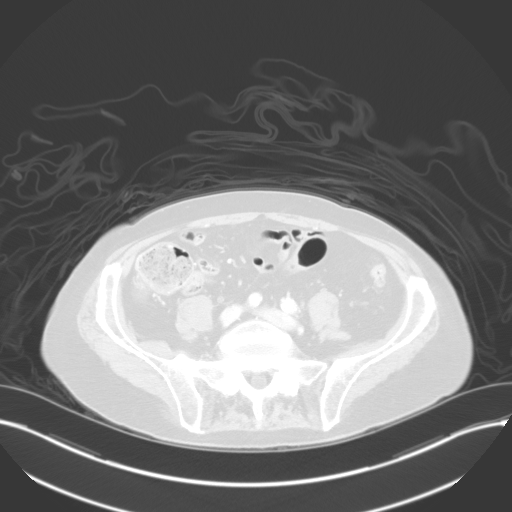
[im 53/131  lung]
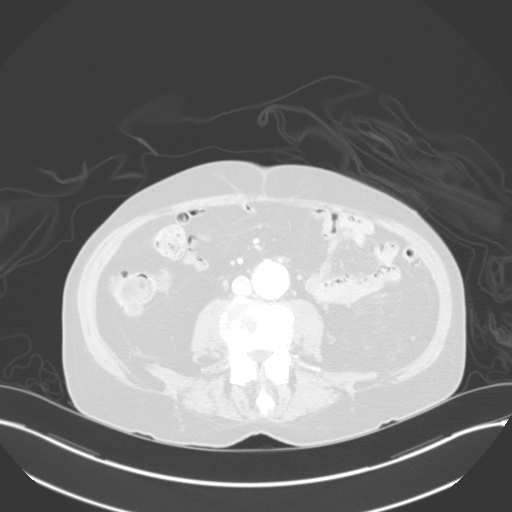
[im 79/131  mediastinal]
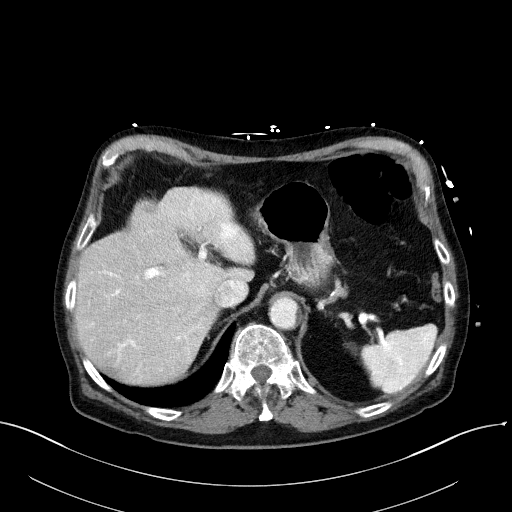
[im 79/131  lung]
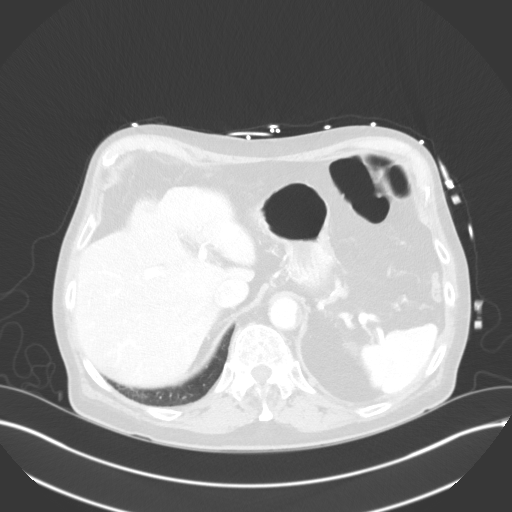
[im 92/131  lung]
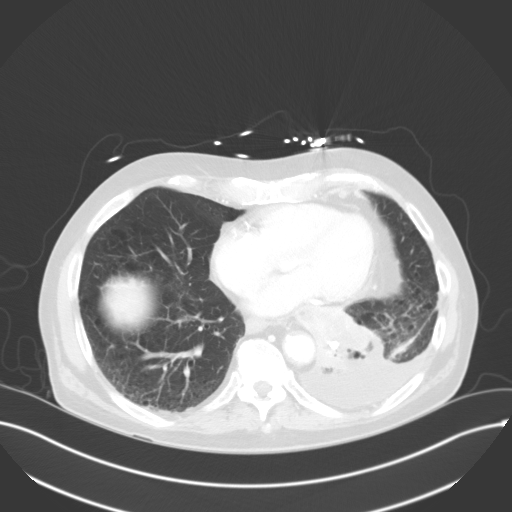
[im 105/131  lung]
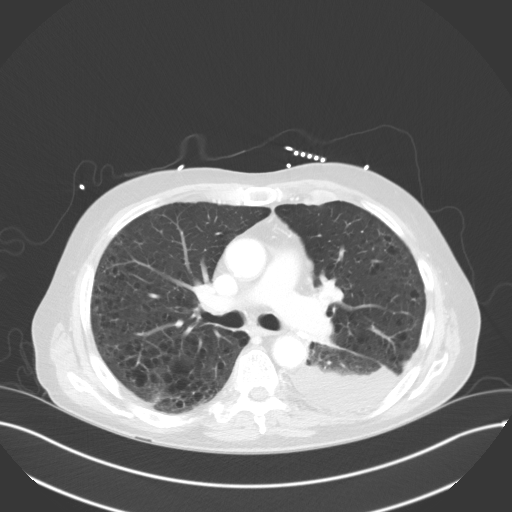
[im 118/131  lung]
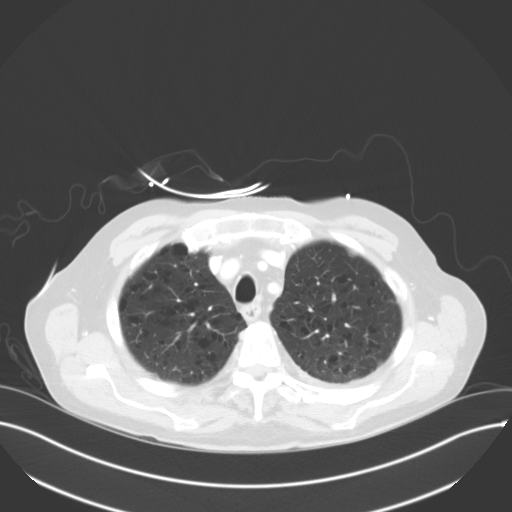

[Series 6: coronals · coronal · 0.69mm/px · 3 of 135 slices shown]
[im 27/135  lung]
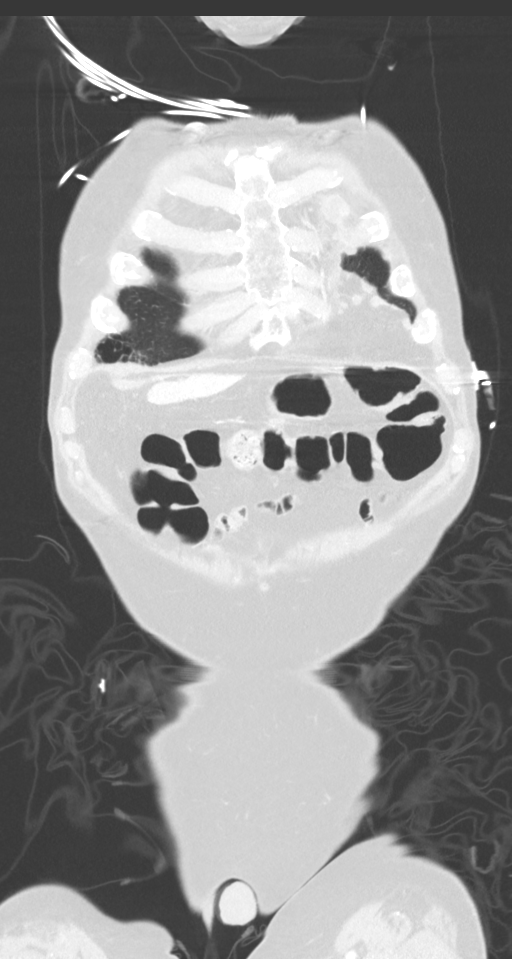
[im 54/135  lung]
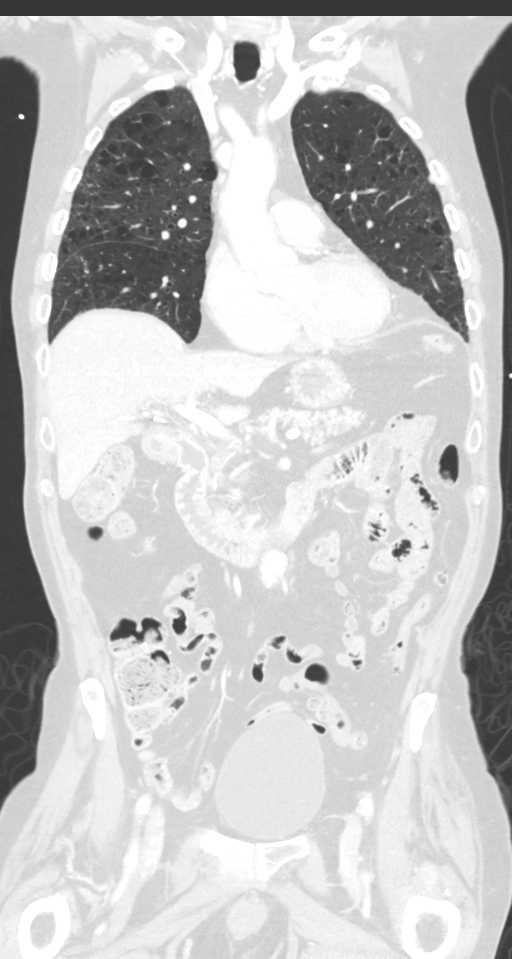
[im 81/135  lung]
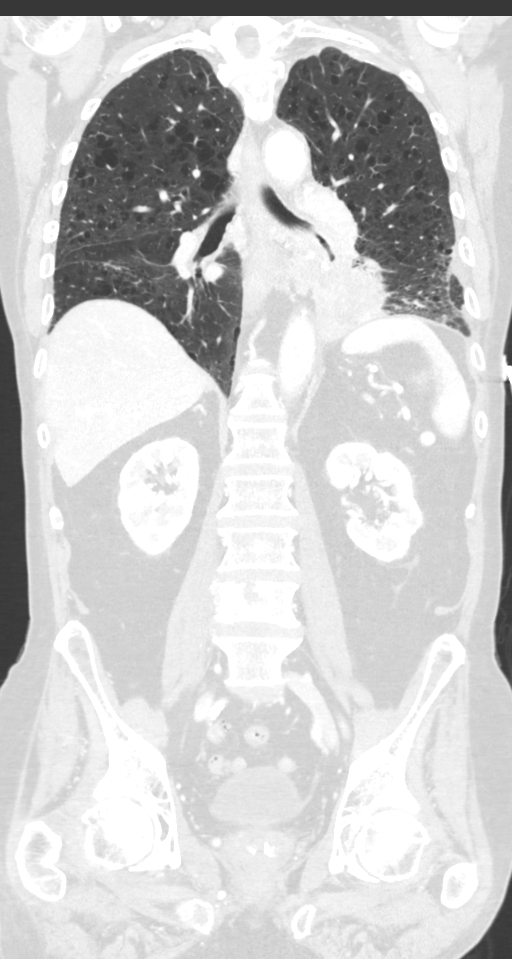

[11 of 36 positions shown; findings below may reference images not displayed]

FINDINGS: CT CHEST FINDINGS

Cardiovascular: Thoracic aortic atherosclerosis without aneurysm or
dissection. Extensive coronary artery atherosclerosis. Normal heart
size. No pericardial effusion.

Mediastinum/Nodes: No enlarged axillary lymph nodes. Borderline AP
window lymph node measures 10 mm in short axis. Left infrahilar mass
as described below.

Lungs/Pleura: A heterogeneous mass with internal calcifications in
the central aspect of the left lower lobe measures 5.0 x 5.2 x
cm and extends to the inferior aspect of the left hilum, encasing
and narrowing the left lower lobe bronchus and left lower lobe
pulmonary artery. The mass mildly invades the adjacent mediastinum.
There is a small left pleural effusion with multiple small foci of
pleural soft tissue nodularity (for example a 1.5 cm nodule on
series 2, image 38). There is left lower lobe atelectasis. Moderate
centrilobular emphysema is noted, and there is left greater than
right apical pleuroparenchymal scarring. Scattered small calcified
and noncalcified lung nodules are present bilaterally. The largest
noncalcified nodules measure 5 mm in the left upper lobe (series 4,
image 31) and 4 mm in the right middle lobe (series 4, image 92).

Musculoskeletal: No suspicious lytic or blastic osseous lesions.
Old, healed posterior right eleventh rib fracture.

CT ABDOMEN PELVIS FINDINGS

Hepatobiliary: 2 cm region of mild hyperenhancement extending to the
periphery of the liver in segment II, isoenhancing on delayed
images. Status post cholecystectomy. No biliary dilatation.

Pancreas: Unremarkable.

Spleen: Multiple small splenic calcifications.

Adrenals/Urinary Tract: Unremarkable adrenal glands. 2.1 cm left
upper pole renal cyst, enlarged from 6886. No hydronephrosis.
Unremarkable bladder.

Stomach/Bowel: Small sliding hiatal hernia. Duodenal diverticulum
without acute inflammation. No bowel obstruction. Left-sided colonic
diverticulosis without evidence of diverticulitis.

Vascular/Lymphatic: New mild aneurysmal dilatation of the infrarenal
abdominal aorta, 3.2 x 3.0 cm maximal diameter. No enlarged lymph
nodes.

Reproductive: Mildly enlarged prostate.

Other: No intraperitoneal free fluid.

Musculoskeletal: 5.6 cm destructive mass involving the right pubis.
Smaller lesion involving the ramus of the right ischium with
cortical disruption as well. Destructive lesion involving the
majority of the L4 vertebral body with pathologic superior endplate
fracture resulting in slight vertebral body height loss. Extra
osseous tumor extension laterally to the right of the L4 vertebral
body. Degenerative appearing spinal stenosis at L4-5 due to disc
bulging and posterior element hypertrophy without gross epidural
tumor identified, though MRI would be much more sensitive.
IMPRESSION: 1. 8 cm central left lower lobe mass consistent with primary
bronchogenic carcinoma.
2. Small left pleural effusion with pleural nodularity compatible
with malignant effusion.
3. Multiple osseous metastases including destructive lesions in the
L4 vertebral body and right pubis.
4. 2 cm region of mild hyperenhancement in the liver, possibly a
benign perfusion abnormality though metastasis is also possible.
Consider nonemergent abdominal MRI for further evaluation.
5. Small bilateral lung nodules measuring up to 5 mm.
6. 3.2 cm infrarenal abdominal aortic aneurysm.
7. Aortic Atherosclerosis ([id number]) and Emphysema ([id number].[id number]).
